# Patient Record
Sex: Female | Born: 1935 | Race: White | Hispanic: No | Marital: Married | Smoking: Never smoker
Health system: Southern US, Community
[De-identification: ages and names within clinical notes are randomized; demographics above are authoritative.]

## PROBLEM LIST (undated history)

## (undated) DIAGNOSIS — E039 Hypothyroidism, unspecified: Secondary | ICD-10-CM

## (undated) DIAGNOSIS — E559 Vitamin D deficiency, unspecified: Secondary | ICD-10-CM

## (undated) DIAGNOSIS — F0390 Unspecified dementia without behavioral disturbance: Secondary | ICD-10-CM

## (undated) DIAGNOSIS — R0989 Other specified symptoms and signs involving the circulatory and respiratory systems: Secondary | ICD-10-CM

## (undated) DIAGNOSIS — I4892 Unspecified atrial flutter: Principal | ICD-10-CM

## (undated) DIAGNOSIS — K589 Irritable bowel syndrome without diarrhea: Secondary | ICD-10-CM

## (undated) DIAGNOSIS — C50919 Malignant neoplasm of unspecified site of unspecified female breast: Secondary | ICD-10-CM

## (undated) HISTORY — PX: BIOPSY THYROID: PRO38

## (undated) HISTORY — PX: MASTECTOMY: SHX3

## (undated) HISTORY — DX: Other specified symptoms and signs involving the circulatory and respiratory systems: R09.89

## (undated) HISTORY — DX: Irritable bowel syndrome without diarrhea: K58.9

## (undated) HISTORY — DX: Hypothyroidism, unspecified: E03.9

## (undated) HISTORY — DX: Vitamin D deficiency, unspecified: E55.9

## (undated) HISTORY — PX: CHOLECYSTECTOMY: SHX55

## (undated) HISTORY — DX: Malignant neoplasm of unspecified site of unspecified female breast: C50.919

---

## 1968-04-19 DIAGNOSIS — C50919 Malignant neoplasm of unspecified site of unspecified female breast: Secondary | ICD-10-CM

## 1968-04-19 HISTORY — DX: Malignant neoplasm of unspecified site of unspecified female breast: C50.919

## 1997-10-01 ENCOUNTER — Ambulatory Visit (HOSPITAL_COMMUNITY): Admission: RE | Admit: 1997-10-01 | Discharge: 1997-10-01 | Payer: Self-pay | Admitting: Internal Medicine

## 1997-10-08 ENCOUNTER — Ambulatory Visit (HOSPITAL_COMMUNITY): Admission: RE | Admit: 1997-10-08 | Discharge: 1997-10-08 | Payer: Self-pay | Admitting: Internal Medicine

## 1997-10-15 ENCOUNTER — Ambulatory Visit (HOSPITAL_COMMUNITY): Admission: RE | Admit: 1997-10-15 | Discharge: 1997-10-15 | Payer: Self-pay | Admitting: Internal Medicine

## 1998-10-13 ENCOUNTER — Ambulatory Visit (HOSPITAL_COMMUNITY): Admission: RE | Admit: 1998-10-13 | Discharge: 1998-10-13 | Payer: Self-pay | Admitting: Internal Medicine

## 1998-10-13 ENCOUNTER — Encounter: Payer: Self-pay | Admitting: Internal Medicine

## 1999-04-07 ENCOUNTER — Other Ambulatory Visit: Admission: RE | Admit: 1999-04-07 | Discharge: 1999-04-07 | Payer: Self-pay | Admitting: Obstetrics and Gynecology

## 1999-10-30 ENCOUNTER — Ambulatory Visit (HOSPITAL_COMMUNITY): Admission: RE | Admit: 1999-10-30 | Discharge: 1999-10-30 | Payer: Self-pay | Admitting: Internal Medicine

## 1999-10-30 ENCOUNTER — Encounter: Payer: Self-pay | Admitting: Internal Medicine

## 2000-03-24 ENCOUNTER — Other Ambulatory Visit: Admission: RE | Admit: 2000-03-24 | Discharge: 2000-03-24 | Payer: Self-pay | Admitting: Obstetrics and Gynecology

## 2000-11-10 ENCOUNTER — Ambulatory Visit (HOSPITAL_COMMUNITY): Admission: RE | Admit: 2000-11-10 | Discharge: 2000-11-10 | Payer: Self-pay | Admitting: Internal Medicine

## 2000-11-10 ENCOUNTER — Encounter: Payer: Self-pay | Admitting: Internal Medicine

## 2000-11-17 ENCOUNTER — Ambulatory Visit (HOSPITAL_COMMUNITY): Admission: RE | Admit: 2000-11-17 | Discharge: 2000-11-17 | Payer: Self-pay | Admitting: Internal Medicine

## 2000-11-17 ENCOUNTER — Encounter: Payer: Self-pay | Admitting: Internal Medicine

## 2000-11-18 ENCOUNTER — Ambulatory Visit (HOSPITAL_COMMUNITY): Admission: RE | Admit: 2000-11-18 | Discharge: 2000-11-18 | Payer: Self-pay | Admitting: Internal Medicine

## 2000-11-18 ENCOUNTER — Encounter: Payer: Self-pay | Admitting: Internal Medicine

## 2000-11-22 ENCOUNTER — Encounter: Payer: Self-pay | Admitting: Internal Medicine

## 2000-11-22 ENCOUNTER — Encounter (INDEPENDENT_AMBULATORY_CARE_PROVIDER_SITE_OTHER): Payer: Self-pay

## 2000-11-22 ENCOUNTER — Ambulatory Visit (HOSPITAL_COMMUNITY): Admission: RE | Admit: 2000-11-22 | Discharge: 2000-11-22 | Payer: Self-pay | Admitting: Internal Medicine

## 2001-03-28 ENCOUNTER — Other Ambulatory Visit: Admission: RE | Admit: 2001-03-28 | Discharge: 2001-03-28 | Payer: Self-pay | Admitting: Obstetrics and Gynecology

## 2001-12-21 ENCOUNTER — Encounter: Payer: Self-pay | Admitting: Internal Medicine

## 2001-12-21 ENCOUNTER — Ambulatory Visit (HOSPITAL_COMMUNITY): Admission: RE | Admit: 2001-12-21 | Discharge: 2001-12-21 | Payer: Self-pay | Admitting: Internal Medicine

## 2002-03-30 ENCOUNTER — Encounter: Payer: Self-pay | Admitting: Internal Medicine

## 2002-03-30 ENCOUNTER — Ambulatory Visit (HOSPITAL_COMMUNITY): Admission: RE | Admit: 2002-03-30 | Discharge: 2002-03-30 | Payer: Self-pay | Admitting: Internal Medicine

## 2002-04-06 ENCOUNTER — Other Ambulatory Visit: Admission: RE | Admit: 2002-04-06 | Discharge: 2002-04-06 | Payer: Self-pay | Admitting: Obstetrics and Gynecology

## 2002-04-17 ENCOUNTER — Ambulatory Visit (HOSPITAL_COMMUNITY): Admission: RE | Admit: 2002-04-17 | Discharge: 2002-04-17 | Payer: Self-pay | Admitting: Internal Medicine

## 2002-04-17 ENCOUNTER — Encounter: Payer: Self-pay | Admitting: Internal Medicine

## 2003-04-30 ENCOUNTER — Other Ambulatory Visit: Admission: RE | Admit: 2003-04-30 | Discharge: 2003-04-30 | Payer: Self-pay | Admitting: Obstetrics and Gynecology

## 2004-04-02 ENCOUNTER — Ambulatory Visit (HOSPITAL_COMMUNITY): Admission: RE | Admit: 2004-04-02 | Discharge: 2004-04-02 | Payer: Self-pay | Admitting: Internal Medicine

## 2004-05-06 ENCOUNTER — Other Ambulatory Visit: Admission: RE | Admit: 2004-05-06 | Discharge: 2004-05-06 | Payer: Self-pay | Admitting: Obstetrics and Gynecology

## 2005-04-14 ENCOUNTER — Ambulatory Visit (HOSPITAL_COMMUNITY): Admission: RE | Admit: 2005-04-14 | Discharge: 2005-04-14 | Payer: Self-pay | Admitting: Internal Medicine

## 2005-05-10 ENCOUNTER — Other Ambulatory Visit: Admission: RE | Admit: 2005-05-10 | Discharge: 2005-05-10 | Payer: Self-pay | Admitting: Obstetrics and Gynecology

## 2007-07-17 ENCOUNTER — Other Ambulatory Visit: Admission: RE | Admit: 2007-07-17 | Discharge: 2007-07-17 | Payer: Self-pay | Admitting: Obstetrics and Gynecology

## 2008-07-23 ENCOUNTER — Other Ambulatory Visit: Admission: RE | Admit: 2008-07-23 | Discharge: 2008-07-23 | Payer: Self-pay | Admitting: Obstetrics and Gynecology

## 2011-08-04 DIAGNOSIS — E559 Vitamin D deficiency, unspecified: Secondary | ICD-10-CM | POA: Diagnosis not present

## 2011-08-04 DIAGNOSIS — Z79899 Other long term (current) drug therapy: Secondary | ICD-10-CM | POA: Diagnosis not present

## 2011-08-04 DIAGNOSIS — R7309 Other abnormal glucose: Secondary | ICD-10-CM | POA: Diagnosis not present

## 2011-08-04 DIAGNOSIS — E782 Mixed hyperlipidemia: Secondary | ICD-10-CM | POA: Diagnosis not present

## 2011-08-04 DIAGNOSIS — I10 Essential (primary) hypertension: Secondary | ICD-10-CM | POA: Diagnosis not present

## 2011-08-04 DIAGNOSIS — Z1212 Encounter for screening for malignant neoplasm of rectum: Secondary | ICD-10-CM | POA: Diagnosis not present

## 2011-09-29 DIAGNOSIS — Z01419 Encounter for gynecological examination (general) (routine) without abnormal findings: Secondary | ICD-10-CM | POA: Diagnosis not present

## 2011-09-29 DIAGNOSIS — Z124 Encounter for screening for malignant neoplasm of cervix: Secondary | ICD-10-CM | POA: Diagnosis not present

## 2011-12-02 DIAGNOSIS — H2510 Age-related nuclear cataract, unspecified eye: Secondary | ICD-10-CM | POA: Diagnosis not present

## 2011-12-23 ENCOUNTER — Encounter: Payer: Self-pay | Admitting: Gastroenterology

## 2012-02-02 DIAGNOSIS — E039 Hypothyroidism, unspecified: Secondary | ICD-10-CM | POA: Diagnosis not present

## 2012-02-02 DIAGNOSIS — Z23 Encounter for immunization: Secondary | ICD-10-CM | POA: Diagnosis not present

## 2012-02-02 DIAGNOSIS — E782 Mixed hyperlipidemia: Secondary | ICD-10-CM | POA: Diagnosis not present

## 2012-02-02 DIAGNOSIS — R030 Elevated blood-pressure reading, without diagnosis of hypertension: Secondary | ICD-10-CM | POA: Diagnosis not present

## 2012-02-02 DIAGNOSIS — Z79899 Other long term (current) drug therapy: Secondary | ICD-10-CM | POA: Diagnosis not present

## 2012-02-02 DIAGNOSIS — E559 Vitamin D deficiency, unspecified: Secondary | ICD-10-CM | POA: Diagnosis not present

## 2012-08-09 DIAGNOSIS — Z79899 Other long term (current) drug therapy: Secondary | ICD-10-CM | POA: Diagnosis not present

## 2012-08-09 DIAGNOSIS — E559 Vitamin D deficiency, unspecified: Secondary | ICD-10-CM | POA: Diagnosis not present

## 2012-08-09 DIAGNOSIS — E782 Mixed hyperlipidemia: Secondary | ICD-10-CM | POA: Diagnosis not present

## 2012-08-09 DIAGNOSIS — Z1212 Encounter for screening for malignant neoplasm of rectum: Secondary | ICD-10-CM | POA: Diagnosis not present

## 2012-08-09 DIAGNOSIS — R7309 Other abnormal glucose: Secondary | ICD-10-CM | POA: Diagnosis not present

## 2012-08-09 DIAGNOSIS — I10 Essential (primary) hypertension: Secondary | ICD-10-CM | POA: Diagnosis not present

## 2012-08-14 ENCOUNTER — Encounter: Payer: Self-pay | Admitting: Gastroenterology

## 2012-08-16 ENCOUNTER — Telehealth: Payer: Self-pay | Admitting: Gastroenterology

## 2012-08-16 NOTE — Telephone Encounter (Signed)
Already had the Procedure somewhere else

## 2013-01-04 DIAGNOSIS — H2510 Age-related nuclear cataract, unspecified eye: Secondary | ICD-10-CM | POA: Diagnosis not present

## 2013-02-13 DIAGNOSIS — Z79899 Other long term (current) drug therapy: Secondary | ICD-10-CM | POA: Diagnosis not present

## 2013-02-13 DIAGNOSIS — E782 Mixed hyperlipidemia: Secondary | ICD-10-CM | POA: Diagnosis not present

## 2013-02-13 DIAGNOSIS — Z23 Encounter for immunization: Secondary | ICD-10-CM | POA: Diagnosis not present

## 2013-02-13 DIAGNOSIS — E039 Hypothyroidism, unspecified: Secondary | ICD-10-CM | POA: Diagnosis not present

## 2013-02-13 DIAGNOSIS — E559 Vitamin D deficiency, unspecified: Secondary | ICD-10-CM | POA: Diagnosis not present

## 2013-02-13 DIAGNOSIS — R7309 Other abnormal glucose: Secondary | ICD-10-CM | POA: Diagnosis not present

## 2013-08-11 DIAGNOSIS — R0989 Other specified symptoms and signs involving the circulatory and respiratory systems: Secondary | ICD-10-CM | POA: Insufficient documentation

## 2013-08-11 DIAGNOSIS — K589 Irritable bowel syndrome without diarrhea: Secondary | ICD-10-CM | POA: Insufficient documentation

## 2013-08-11 DIAGNOSIS — C50919 Malignant neoplasm of unspecified site of unspecified female breast: Secondary | ICD-10-CM | POA: Insufficient documentation

## 2013-08-11 DIAGNOSIS — E039 Hypothyroidism, unspecified: Secondary | ICD-10-CM | POA: Insufficient documentation

## 2013-08-13 ENCOUNTER — Encounter: Payer: Self-pay | Admitting: Internal Medicine

## 2013-08-13 ENCOUNTER — Ambulatory Visit (INDEPENDENT_AMBULATORY_CARE_PROVIDER_SITE_OTHER): Payer: PRIVATE HEALTH INSURANCE | Admitting: Internal Medicine

## 2013-08-13 VITALS — BP 128/70 | HR 80 | Temp 98.1°F | Resp 16 | Ht 68.0 in | Wt 126.6 lb

## 2013-08-13 DIAGNOSIS — I10 Essential (primary) hypertension: Secondary | ICD-10-CM | POA: Diagnosis not present

## 2013-08-13 DIAGNOSIS — E782 Mixed hyperlipidemia: Secondary | ICD-10-CM | POA: Diagnosis not present

## 2013-08-13 DIAGNOSIS — Z789 Other specified health status: Secondary | ICD-10-CM

## 2013-08-13 DIAGNOSIS — F0280 Dementia in other diseases classified elsewhere without behavioral disturbance: Secondary | ICD-10-CM | POA: Insufficient documentation

## 2013-08-13 DIAGNOSIS — G301 Alzheimer's disease with late onset: Secondary | ICD-10-CM

## 2013-08-13 DIAGNOSIS — Z79899 Other long term (current) drug therapy: Secondary | ICD-10-CM | POA: Diagnosis not present

## 2013-08-13 DIAGNOSIS — R7309 Other abnormal glucose: Secondary | ICD-10-CM | POA: Diagnosis not present

## 2013-08-13 DIAGNOSIS — R0989 Other specified symptoms and signs involving the circulatory and respiratory systems: Secondary | ICD-10-CM

## 2013-08-13 DIAGNOSIS — Z1212 Encounter for screening for malignant neoplasm of rectum: Secondary | ICD-10-CM

## 2013-08-13 DIAGNOSIS — E559 Vitamin D deficiency, unspecified: Secondary | ICD-10-CM | POA: Diagnosis not present

## 2013-08-13 DIAGNOSIS — R7303 Prediabetes: Secondary | ICD-10-CM | POA: Insufficient documentation

## 2013-08-13 DIAGNOSIS — Z1331 Encounter for screening for depression: Secondary | ICD-10-CM

## 2013-08-13 LAB — CBC WITH DIFFERENTIAL/PLATELET
Basophils Absolute: 0 10*3/uL (ref 0.0–0.1)
Basophils Relative: 1 % (ref 0–1)
EOS PCT: 2 % (ref 0–5)
Eosinophils Absolute: 0.1 10*3/uL (ref 0.0–0.7)
HEMATOCRIT: 40.4 % (ref 36.0–46.0)
Hemoglobin: 13.7 g/dL (ref 12.0–15.0)
LYMPHS ABS: 0.9 10*3/uL (ref 0.7–4.0)
LYMPHS PCT: 23 % (ref 12–46)
MCH: 32.3 pg (ref 26.0–34.0)
MCHC: 33.9 g/dL (ref 30.0–36.0)
MCV: 95.3 fL (ref 78.0–100.0)
Monocytes Absolute: 0.4 10*3/uL (ref 0.1–1.0)
Monocytes Relative: 10 % (ref 3–12)
Neutro Abs: 2.6 10*3/uL (ref 1.7–7.7)
Neutrophils Relative %: 64 % (ref 43–77)
PLATELETS: 224 10*3/uL (ref 150–400)
RBC: 4.24 MIL/uL (ref 3.87–5.11)
RDW: 13.9 % (ref 11.5–15.5)
WBC: 4.1 10*3/uL (ref 4.0–10.5)

## 2013-08-13 NOTE — Progress Notes (Signed)
Patient ID: Amber Chan, female   DOB: October 26, 1934, 79 y.o.   MRN: [id number]   Annual Screening Comprehensive Examination  This very nice 79 y.o. MWF presents for complete physical.  Patient has been followed for Hx of elevated BP, screening for abnormal glucose, Hypothyroidism, Hyperlipidemia, remote Hx/o Breast cancer, SDAT and Vitamin D Deficiency. Also, patient has mild SDAT and remains functional at home in her ADL's and personal hygiene, but requires supervision by her spouse and primarily she is limited by ST memory loss.    Labile elevated BP in the past has been monitored expectantly Patient's BP has been controlled with otday's BP: 128/70 mmHg. Patient denies any cardiac symptoms as chest pain, palpitations, shortness of breath, dizziness or ankle swelling.   Patient's hyperlipidemia is controlled with diet. Patient denies myalgias. Last cholesterol last visit was 164, triglycerides 81, HDL 66 and LDL 82 in Oct 2014 - at goal.     Patient has prediabetes/insulin resistance with elevated insulin of 54 in Apr 2014 and 59 in Oct 2014 - both with nl A1c's but felt consistent with insulin resistance and thus early diabetes. Patient denies reactive hypoglycemic symptoms, visual blurring, diabetic polys, or paresthesias.    Also, patient has been on thyroid replacement since 2010. Finally, patient has history of Vitamin D Deficiency of 22 in Jan 2008 and last vitamin D was 27 in Oct 2014.  Medication Sig  . BABY ASPIRIN PO Take 81 mg by mouth daily.  . Cholecalciferol (VITAMIN D PO) Take 2,000 Units by mouth 2 (two) times daily.   Marland Kitchen donepezil (ARICEPT) 10 MG tablet Take 10 mg by mouth at bedtime.  Marland Kitchen levothyroxine  50 MCG tablet Take 50 mcg by mouth daily before breakfast.   Allergies no known allergies  Past Medical History  Diagnosis Date  . Labile hypertension   . Hypothyroid   . IBS (irritable bowel syndrome)   . Vitamin D deficiency   . Breast cancer 1970    Right   Past  Surgical History  Procedure Laterality Date  . Mastectomy      right 1978 left 1980  . Cholecystectomy      1988  . Biopsy thyroid      1999   Family History  Problem Relation Age of Onset  . Hypertension Mother   . Cancer Mother     colon  . Stroke Father   . Heart disease Father   . Diabetes Brother     History  Substance Use Topics  . Smoking status: Never Smoker   . Smokeless tobacco: Not on file  . Alcohol Use: No    ROS Constitutional: Denies fever, chills, weight loss/gain, headaches, insomnia, fatigue, night sweats, and change in appetite. Eyes: Denies redness, blurred vision, diplopia, discharge, itchy, watery eyes.  ENT: Denies discharge, congestion, post nasal drip, epistaxis, sore throat, earache, hearing loss, dental pain, Tinnitus, Vertigo, Sinus pain, snoring.  Cardio: Denies chest pain, palpitations, irregular heartbeat, syncope, dyspnea, diaphoresis, orthopnea, PND, claudication, edema Respiratory: denies cough, dyspnea, DOE, pleurisy, hoarseness, laryngitis, wheezing.  Gastrointestinal: Denies dysphagia, heartburn, reflux, water brash, pain, cramps, nausea, vomiting, bloating, diarrhea, constipation, hematemesis, melena, hematochezia, jaundice, hemorrhoids Genitourinary: Denies dysuria, frequency, urgency, nocturia, hesitancy, discharge, hematuria, flank pain Breast:Breast lumps, nipple discharge, bleeding.  Musculoskeletal: Denies arthralgia, myalgia, stiffness, Jt. Swelling, pain, limp, and strain/sprain. Skin: Denies puritis, rash, hives, warts, acne, eczema, changing in skin lesion Neuro: No weakness, tremor, incoordination, balance issues, spasms, paresthesia, pain Psychiatric: Denies confusion, , sensory loss  Endocrine: Denies change in weight, skin, hair change, nocturia, and paresthesia, diabetic polys, visual blurring, hyper / hypo glycemic episodes.  Heme/Lymph: No excessive bleeding, bruising, enlarged lymph nodes.  Physical Exam  BP 128/70   Pulse 80  Temp 98.1 F   Resp 16  Ht 5\' 8"    Wt 126 lb 9.6 oz   BMI 19.25 kg/m2  General Appearance: Well nourished, in no apparent distress. Eyes: PERRLA, EOMs, conjunctiva no swelling or erythema, normal fundi and vessels. Sinuses: No frontal/maxillary tenderness ENT/Mouth: EACs patent / TMs  nl. Nares clear without erythema, swelling, mucoid exudates. Oral hygiene is good. No erythema, swelling, or exudate. Tongue normal, non-obstructing. Tonsils not swollen or erythematous. Hearing normal.  Neck: Supple, thyroid normal. No bruits, nodes or JVD. Respiratory: Respiratory effort normal.  BS equal and clear bilateral without rales, rhonci, wheezing or stridor. Cardio: Heart sounds are normal with regular rate and rhythm and no murmurs, rubs or gallops. Peripheral pulses are normal and equal bilaterally without edema. No aortic or femoral bruits. Chest: symmetric with normal excursions and percussion. Breasts: Symmetric, without lumps, nipple discharge, retractions, or fibrocystic changes.  Abdomen: Flat, soft, with bowl sounds. Nontender, no guarding, rebound, hernias, masses, or organomegaly.  Lymphatics: Non tender without lymphadenopathy.  Genitourinary:  Musculoskeletal: Full ROM all peripheral extremities, joint stability, 5/5 strength, and normal gait. Skin: Warm and dry without rashes, lesions, cyanosis, clubbing or  ecchymosis.  Neuro: Cranial nerves intact, reflexes equal bilaterally. Normal muscle tone, no cerebellar symptoms. Sensation intact.  Pysch: Awake and oriented X 3, normal affect, Insight and Judgment appropriate.   Assessment and Plan  1. Annual Screening Examination 2. Hypertension, Labile 3. Hyperlipidemia - controlled with diet 4. Pre Diabetes/ Insulin Resistance 5. Vitamin D Deficiency 6. Breast cancer, Right (1976) 7. SDAT  Continue prudent diet as discussed, weight control, BP monitoring, regular exercise, and medications. Discussed med's effects and SE's.  Screening labs and tests as requested with regular follow-up as recommended.

## 2013-08-13 NOTE — Patient Instructions (Signed)

## 2013-08-14 LAB — HEMOGLOBIN A1C
Hgb A1c MFr Bld: 5.6 % (ref <5.7)
Mean Plasma Glucose: 114 mg/dL (ref <117)

## 2013-08-14 LAB — INSULIN, FASTING: Insulin fasting, serum: 42 u[IU]/mL — ABNORMAL HIGH (ref 3–28)

## 2013-08-14 LAB — URINALYSIS, MICROSCOPIC ONLY
Bacteria, UA: NONE SEEN
CRYSTALS: NONE SEEN

## 2013-08-14 LAB — TSH: TSH: 2.193 u[IU]/mL (ref 0.350–4.500)

## 2013-08-14 LAB — BASIC METABOLIC PANEL WITH GFR
BUN: 21 mg/dL (ref 6–23)
CHLORIDE: 103 meq/L (ref 96–112)
CO2: 29 meq/L (ref 19–32)
CREATININE: 0.99 mg/dL (ref 0.50–1.10)
Calcium: 9 mg/dL (ref 8.4–10.5)
GFR, Est African American: 64 mL/min
GFR, Est Non African American: 56 mL/min — ABNORMAL LOW
Glucose, Bld: 93 mg/dL (ref 70–99)
Potassium: 3.8 mEq/L (ref 3.5–5.3)
Sodium: 143 mEq/L (ref 135–145)

## 2013-08-14 LAB — VITAMIN D 25 HYDROXY (VIT D DEFICIENCY, FRACTURES): Vit D, 25-Hydroxy: 81 ng/mL (ref 30–89)

## 2013-08-14 LAB — HEPATIC FUNCTION PANEL
ALBUMIN: 4 g/dL (ref 3.5–5.2)
ALK PHOS: 74 U/L (ref 39–117)
ALT: 15 U/L (ref 0–35)
AST: 21 U/L (ref 0–37)
BILIRUBIN DIRECT: 0.1 mg/dL (ref 0.0–0.3)
BILIRUBIN INDIRECT: 0.4 mg/dL (ref 0.2–1.2)
BILIRUBIN TOTAL: 0.5 mg/dL (ref 0.2–1.2)
Total Protein: 6.3 g/dL (ref 6.0–8.3)

## 2013-08-14 LAB — LIPID PANEL
CHOL/HDL RATIO: 2.5 ratio
Cholesterol: 158 mg/dL (ref 0–200)
HDL: 63 mg/dL (ref >39)
LDL CALC: 78 mg/dL (ref 0–99)
Triglycerides: 85 mg/dL (ref <150)
VLDL: 17 mg/dL (ref 0–40)

## 2013-08-14 LAB — MICROALBUMIN / CREATININE URINE RATIO
Creatinine, Urine: 183.9 mg/dL
MICROALB UR: 0.89 mg/dL (ref 0.00–1.89)
Microalb Creat Ratio: 4.8 mg/g (ref 0.0–30.0)

## 2013-08-14 LAB — MAGNESIUM: Magnesium: 1.9 mg/dL (ref 1.5–2.5)

## 2013-08-22 ENCOUNTER — Other Ambulatory Visit (INDEPENDENT_AMBULATORY_CARE_PROVIDER_SITE_OTHER): Payer: Medicare Other | Admitting: *Deleted

## 2013-08-22 DIAGNOSIS — Z1212 Encounter for screening for malignant neoplasm of rectum: Secondary | ICD-10-CM

## 2013-08-22 LAB — POC HEMOCCULT BLD/STL (HOME/3-CARD/SCREEN)
FECAL OCCULT BLD: NEGATIVE
FECAL OCCULT BLD: NEGATIVE
Fecal Occult Blood, POC: NEGATIVE

## 2013-08-25 ENCOUNTER — Observation Stay (HOSPITAL_COMMUNITY): Payer: Medicare Other

## 2013-08-25 ENCOUNTER — Inpatient Hospital Stay (HOSPITAL_COMMUNITY)
Admission: EM | Admit: 2013-08-25 | Discharge: 2013-08-29 | DRG: 251 | Disposition: A | Discharge status: Home / Self Care | Payer: Medicare Other | Attending: Internal Medicine | Admitting: Internal Medicine

## 2013-08-25 ENCOUNTER — Encounter (HOSPITAL_COMMUNITY): Payer: Self-pay | Admitting: Emergency Medicine

## 2013-08-25 ENCOUNTER — Emergency Department (HOSPITAL_COMMUNITY): Payer: Medicare Other

## 2013-08-25 DIAGNOSIS — IMO0002 Reserved for concepts with insufficient information to code with codable children: Secondary | ICD-10-CM

## 2013-08-25 DIAGNOSIS — Z9181 History of falling: Secondary | ICD-10-CM | POA: Diagnosis not present

## 2013-08-25 DIAGNOSIS — R0989 Other specified symptoms and signs involving the circulatory and respiratory systems: Secondary | ICD-10-CM

## 2013-08-25 DIAGNOSIS — I4892 Unspecified atrial flutter: Secondary | ICD-10-CM | POA: Diagnosis not present

## 2013-08-25 DIAGNOSIS — R42 Dizziness and giddiness: Secondary | ICD-10-CM | POA: Diagnosis not present

## 2013-08-25 DIAGNOSIS — G309 Alzheimer's disease, unspecified: Secondary | ICD-10-CM | POA: Diagnosis present

## 2013-08-25 DIAGNOSIS — R55 Syncope and collapse: Secondary | ICD-10-CM

## 2013-08-25 DIAGNOSIS — I10 Essential (primary) hypertension: Secondary | ICD-10-CM | POA: Diagnosis not present

## 2013-08-25 DIAGNOSIS — E782 Mixed hyperlipidemia: Secondary | ICD-10-CM | POA: Diagnosis not present

## 2013-08-25 DIAGNOSIS — I469 Cardiac arrest, cause unspecified: Secondary | ICD-10-CM | POA: Diagnosis present

## 2013-08-25 DIAGNOSIS — R112 Nausea with vomiting, unspecified: Secondary | ICD-10-CM | POA: Diagnosis not present

## 2013-08-25 DIAGNOSIS — Z853 Personal history of malignant neoplasm of breast: Secondary | ICD-10-CM

## 2013-08-25 DIAGNOSIS — C50919 Malignant neoplasm of unspecified site of unspecified female breast: Secondary | ICD-10-CM | POA: Diagnosis not present

## 2013-08-25 DIAGNOSIS — Z9010 Acquired absence of unspecified breast and nipple: Secondary | ICD-10-CM

## 2013-08-25 DIAGNOSIS — Z7982 Long term (current) use of aspirin: Secondary | ICD-10-CM | POA: Diagnosis not present

## 2013-08-25 DIAGNOSIS — E039 Hypothyroidism, unspecified: Secondary | ICD-10-CM | POA: Diagnosis present

## 2013-08-25 DIAGNOSIS — G301 Alzheimer's disease with late onset: Secondary | ICD-10-CM

## 2013-08-25 DIAGNOSIS — F0280 Dementia in other diseases classified elsewhere without behavioral disturbance: Secondary | ICD-10-CM | POA: Diagnosis present

## 2013-08-25 DIAGNOSIS — R404 Transient alteration of awareness: Secondary | ICD-10-CM | POA: Diagnosis not present

## 2013-08-25 DIAGNOSIS — J90 Pleural effusion, not elsewhere classified: Secondary | ICD-10-CM | POA: Diagnosis not present

## 2013-08-25 DIAGNOSIS — R7309 Other abnormal glucose: Secondary | ICD-10-CM

## 2013-08-25 DIAGNOSIS — R001 Bradycardia, unspecified: Secondary | ICD-10-CM

## 2013-08-25 DIAGNOSIS — Z8249 Family history of ischemic heart disease and other diseases of the circulatory system: Secondary | ICD-10-CM | POA: Diagnosis not present

## 2013-08-25 DIAGNOSIS — J449 Chronic obstructive pulmonary disease, unspecified: Secondary | ICD-10-CM | POA: Diagnosis not present

## 2013-08-25 DIAGNOSIS — E559 Vitamin D deficiency, unspecified: Secondary | ICD-10-CM

## 2013-08-25 DIAGNOSIS — K589 Irritable bowel syndrome without diarrhea: Secondary | ICD-10-CM | POA: Diagnosis present

## 2013-08-25 DIAGNOSIS — Z79899 Other long term (current) drug therapy: Secondary | ICD-10-CM | POA: Diagnosis not present

## 2013-08-25 DIAGNOSIS — I319 Disease of pericardium, unspecified: Secondary | ICD-10-CM | POA: Diagnosis not present

## 2013-08-25 DIAGNOSIS — J438 Other emphysema: Secondary | ICD-10-CM | POA: Diagnosis not present

## 2013-08-25 DIAGNOSIS — I498 Other specified cardiac arrhythmias: Secondary | ICD-10-CM | POA: Diagnosis not present

## 2013-08-25 HISTORY — DX: Unspecified dementia, unspecified severity, without behavioral disturbance, psychotic disturbance, mood disturbance, and anxiety: F03.90

## 2013-08-25 HISTORY — DX: Unspecified atrial flutter: I48.92

## 2013-08-25 LAB — CBC
HCT: 43 % (ref 36.0–46.0)
Hemoglobin: 14.4 g/dL (ref 12.0–15.0)
MCH: 32.1 pg (ref 26.0–34.0)
MCHC: 33.5 g/dL (ref 30.0–36.0)
MCV: 96 fL (ref 78.0–100.0)
PLATELETS: 189 10*3/uL (ref 150–400)
RBC: 4.48 MIL/uL (ref 3.87–5.11)
RDW: 13.3 % (ref 11.5–15.5)
WBC: 9.7 10*3/uL (ref 4.0–10.5)

## 2013-08-25 LAB — BASIC METABOLIC PANEL
BUN: 22 mg/dL (ref 6–23)
CHLORIDE: 104 meq/L (ref 96–112)
CO2: 25 mEq/L (ref 19–32)
CREATININE: 1.06 mg/dL (ref 0.50–1.10)
Calcium: 8.8 mg/dL (ref 8.4–10.5)
GFR, EST AFRICAN AMERICAN: 58 mL/min — AB (ref >90)
GFR, EST NON AFRICAN AMERICAN: 50 mL/min — AB (ref >90)
Glucose, Bld: 142 mg/dL — ABNORMAL HIGH (ref 70–99)
POTASSIUM: 3.5 meq/L — AB (ref 3.7–5.3)
Sodium: 143 mEq/L (ref 137–147)

## 2013-08-25 LAB — PROTIME-INR
INR: 1.07 (ref 0.00–1.49)
Prothrombin Time: 13.7 seconds (ref 11.6–15.2)

## 2013-08-25 LAB — I-STAT TROPONIN, ED: TROPONIN I, POC: 0.01 ng/mL (ref 0.00–0.08)

## 2013-08-25 LAB — CBG MONITORING, ED: GLUCOSE-CAPILLARY: 114 mg/dL — AB (ref 70–99)

## 2013-08-25 LAB — I-STAT CG4 LACTIC ACID, ED: LACTIC ACID, VENOUS: 1.66 mmol/L (ref 0.5–2.2)

## 2013-08-25 MED ORDER — SODIUM CHLORIDE 0.9 % IV BOLUS (SEPSIS)
1000.0000 mL | Freq: Once | INTRAVENOUS | Status: AC
  Administered 2013-08-25: 1000 mL via INTRAVENOUS

## 2013-08-25 MED ORDER — IOHEXOL 350 MG/ML SOLN
100.0000 mL | Freq: Once | INTRAVENOUS | Status: AC | PRN
  Administered 2013-08-25: 80 mL via INTRAVENOUS

## 2013-08-25 NOTE — ED Notes (Addendum)
Patient returned from [facility]. Will have the patient transferred upstairs.

## 2013-08-25 NOTE — ED Notes (Signed)
Called CT for ETA time for patient.

## 2013-08-25 NOTE — ED Notes (Signed)
Dr.Walden at the bedside.  

## 2013-08-25 NOTE — ED Notes (Signed)
Per EMS, the patient's husband called from home for syncope episode with temporary loss of consciousness.  He lowered her down and lowered her to the floor.  When EMS arrived, she was conscious, but did have a seizure episode and lost pulses for 45 seconds.  Initial BP of 60 on palpation, so patient was given 561mL and placed in trendelenberg.  BP 130/86 was last reading. She also received 4mg  of zofran. She has 18g in Left A/C, and 20g in right AC.  Patient is wearing non-rebreather on arrival.

## 2013-08-25 NOTE — Consult Note (Addendum)
CARDIOLOGY CONSULT NOTE   Patient ID: Amber Chan MRN: [id number], DOB/AGE: 08/15/1934   Admit date: 08/25/2013 Date of Consult: 08/25/2013   Primary Physician: Alesia Richards, MD Primary Cardiologist: None  Pt. Profile 79F with dementia, labile HTN, hypothyroidism, remote history of breast cancer, who is admitted with syncope and found to be in new onset AFL with absent ventricular response on EMS rhythm strip. Although rhythm strip only includes a few seconds, she clearly had a pause of at least 6 seconds in setting of AFL. EMS reported she was without a pulse for 45 seconds.    Problem List  Past Medical History  Diagnosis Date  . Labile hypertension   . Hypothyroid   . IBS (irritable bowel syndrome)   . Vitamin D deficiency   . Breast cancer 1970    Right    Past Surgical History  Procedure Laterality Date  . Mastectomy      right 1978 left 1980  . Cholecystectomy      1988  . Biopsy thyroid      1999     Allergies  No Known Allergies  HPI   79F with dementia, labile HTN, hypothyroidism, remote history of breast cancer, who is admitted with syncope and found to be in new onset AFL with absent ventricular response on EMS rhythm strip.  Per husband, patient got up from a seated positing and had LOC without prodrome. Was in [place] prior to this. EMS was called. With EMS, she developed a "pulseless" episode lasting for 45 seconds.  She regained consciousness before CPR was initiated.   Although rhythm strip from EMS only includes a few seconds, she clearly had a pause of at least 6 seconds in setting of AFL. She is not on any nodal agents.   She has had multiple falls but they have not been witnessed and husband is not sure if there was LOC (and patient is a poor historian).    Family History Family History  Problem Relation Age of Onset  . Hypertension Mother   . Cancer Mother     colon  . Stroke Father   . Heart disease Father   . Diabetes  Brother      Social History History   Social History  . Marital Status: Married    Spouse Name: N/A    Number of Children: N/A  . Years of Education: N/A   Occupational History  . Not on file.   Social History Main Topics  . Smoking status: Never Smoker   . Smokeless tobacco: Not on file  . Alcohol Use: No  . Drug Use: No  . Sexual Activity: Not on file   Other Topics Concern  . Not on file   Social History Narrative  . No narrative on file     Review of Systems All systems reviewed and are negative except as noted above.  Physical Exam  Blood pressure 133/77, pulse 80, temperature 98.1 F (36.7 C), temperature source Oral, resp. rate 20, height 5\' 7"  (1.702 m), weight 58.968 kg (130 lb), SpO2 99.00%.  General: Pleasant, NAD. Thin, frail.  Psych: Normal affect. Neuro: Alert. Moves all extremities spontaneously. HEENT: Normal  Neck: Supple without bruits or JVD. Lungs:  Resp regular and unlabored, CTA. Heart: RRR no s3, s4, or murmurs. Abdomen: Soft, non-tender, non-distended, BS + x 4.  Extremities: No clubbing, cyanosis or edema. DP/PT/Radials 2+ and equal bilaterally.  Labs  No results found for this basename: CKTOTAL, CKMB, TROPONINI,  in the last 72 hours Lab Results  Component Value Date   WBC 9.7 08/25/2013   HGB 14.4 08/25/2013   HCT 43.0 08/25/2013   MCV 96.0 08/25/2013   PLT 189 08/25/2013    Recent Labs Lab 08/25/13 2033  NA 143  K 3.5*  CL 104  CO2 25  BUN 22  CREATININE 1.06  CALCIUM 8.8  GLUCOSE 142*   Lab Results  Component Value Date   CHOL 158 08/13/2013   HDL 63 08/13/2013   LDLCALC 78 08/13/2013   TRIG 85 08/13/2013   No results found for this basename: DDIMER    Radiology/Studies  Dg Chest Port 1 View  08/25/2013   CLINICAL DATA:  Chest pain. Current history of treated hypothyroidism, labile hypertension. Prior history of breast cancer post mastectomy.  EXAM: PORTABLE CHEST - 1 VIEW  COMPARISON:  CHEST-2V dated 05/26/2007; DG CHEST 2  VIEW dated 04/14/2005; DG CHEST 2 VIEW dated 04/02/2004; DG CHEST 2 VIEW dated 04/17/2002  FINDINGS: Cardiac silhouette moderately to markedly enlarged but stable. Calcified granuloma in the right upper lobe, unchanged. Emphysematous changes throughout both lungs with hyperinflation, unchanged. Lungs otherwise clear. No localized airspace consolidation. No pleural effusions. No pneumothorax. Normal pulmonary vascularity.  IMPRESSION: COPD/emphysema. No acute cardiopulmonary disease. Stable moderate to marked cardiomegaly.   Electronically Signed   By: Evangeline Dakin M.D.   On: 08/25/2013 21:12    ECG Sinus. RAE. Low volts.   ASSESSMENT AND PLAN 89F with dementia, labile HTN, hypothyroidism, remote history of breast cancer, who is admitted with syncope and found to be in new onset AFL with absent ventricular response on EMS rhythm strip. Although rhythm strip only includes a few seconds, she clearly had a pause of at least 6 seconds in setting of AFL.  She is admitted to the medical service.   AFL, new onset, with slow ventricular response. CHADSVASC score of 3 (HTN = 1, Age = 2) 1. Avoid nodal agents 2. Obtain echocardiogram 3. Check TSH 4. EP consult for consideration of permanent pacemaker.  5. Telemetry monitoring 6. Risk/benefit ratio of systemic anticoagulation may favor no anticoagulation given history of falls. However, if falls are secondary to brady arrhythmias, it could in fact be safe.   Signed, Lamar Sprinkles, MD  08/25/2013, 11:06 PM

## 2013-08-25 NOTE — ED Notes (Signed)
Patient transported to CT 

## 2013-08-25 NOTE — ED Notes (Signed)
Called CT to see how soon patient can go for testing.  Patient is currently on the list.

## 2013-08-25 NOTE — ED Notes (Signed)
Portable CXR is at the bedside.

## 2013-08-25 NOTE — ED Notes (Signed)
Attempted to call report

## 2013-08-25 NOTE — ED Notes (Signed)
Reported to receiving nurse that patient will report to floor after CTA is completed.

## 2013-08-25 NOTE — ED Provider Notes (Signed)
CSN: [id number]     Arrival date & time 08/25/13  2017 History   First MD Initiated Contact with Patient 08/25/13 2025     Chief Complaint  Patient presents with  . Loss of Consciousness     (Consider location/radiation/quality/duration/timing/severity/associated sxs/prior Treatment) HPI Comments: First episode without preceding symptoms at home - lowered to floor by husband. 2nd episode with EMS - pulseless for about 45 seconds, rhythm strips produced by EMS. Patient regained consciousness prior to compressions being started.  Patient is a 79 y.o. female presenting with syncope. The history is provided by the patient.  Loss of Consciousness Episode history:  Multiple Most recent episode:  Today Duration:  45 seconds Timing:  Intermittent Progression:  Unchanged Chronicity:  New Context comment:  Spontaneously Witnessed: yes   Relieved by:  Nothing Associated symptoms: no chest pain, no fever and no shortness of breath   Risk factors: no congenital heart disease and no coronary artery disease     Past Medical History  Diagnosis Date  . Labile hypertension   . Hypothyroid   . IBS (irritable bowel syndrome)   . Vitamin D deficiency   . Breast cancer 1970    Right   Past Surgical History  Procedure Laterality Date  . Mastectomy      right 1978 left 1980  . Cholecystectomy      1988  . Biopsy thyroid      1999   Family History  Problem Relation Age of Onset  . Hypertension Mother   . Cancer Mother     colon  . Stroke Father   . Heart disease Father   . Diabetes Brother    History  Substance Use Topics  . Smoking status: Never Smoker   . Smokeless tobacco: Not on file  . Alcohol Use: No   OB History   Grav Para Term Preterm Abortions TAB SAB Ect Mult Living                 Review of Systems  Constitutional: Negative for fever.  Respiratory: Negative for cough and shortness of breath.   Cardiovascular: Positive for syncope. Negative for chest pain and leg  swelling.  All other systems reviewed and are negative.     Allergies  Review of patient's allergies indicates no known allergies.  Home Medications   Prior to Admission medications   Medication Sig Start Date End Date Taking? Authorizing Provider  BABY ASPIRIN PO Take 81 mg by mouth daily.    Historical Provider, MD  calcium carbonate (TUMS - DOSED IN MG ELEMENTAL CALCIUM) 500 MG chewable tablet Chew 2 tablets by mouth 2 (two) times daily.    Historical Provider, MD  Cholecalciferol (VITAMIN D PO) Take 2,000 Units by mouth 2 (two) times daily.     Historical Provider, MD  donepezil (ARICEPT) 10 MG tablet Take 10 mg by mouth at bedtime.    Historical Provider, MD  levothyroxine (SYNTHROID, LEVOTHROID) 50 MCG tablet Take 50 mcg by mouth daily before breakfast.    Historical Provider, MD   BP 137/70  Pulse 74  Temp(Src) 98.1 F (36.7 C) (Oral)  Resp 24  Ht 5\' 7"  (1.702 m)  Wt 130 lb (58.968 kg)  BMI 20.36 kg/m2  SpO2 100% Physical Exam  Nursing note and vitals reviewed. Constitutional: She appears well-developed and well-nourished. No distress.  HENT:  Head: Normocephalic and atraumatic.  Eyes: EOM are normal. Pupils are equal, round, and reactive to light.  Neck: Normal range of  motion. Neck supple.  Cardiovascular: Normal rate and regular rhythm.  Exam reveals no friction rub.   No murmur heard. Pulmonary/Chest: Effort normal and breath sounds normal. No respiratory distress. She has no wheezes. She has no rales.  Abdominal: Soft. She exhibits no distension. There is no tenderness. There is no rebound.  Musculoskeletal: Normal range of motion. She exhibits no edema.  Neurological: She is alert. No cranial nerve deficit. She exhibits normal muscle tone.  Skin: No rash noted. She is not diaphoretic.    ED Course  Procedures (including critical care time) Labs Review Labs Reviewed  CBC  BASIC METABOLIC PANEL  I-STAT TROPOININ, ED  I-STAT CG4 LACTIC ACID, ED     Imaging Review Dg Chest Port 1 View  08/25/2013   CLINICAL DATA:  Chest pain. Current history of treated hypothyroidism, labile hypertension. Prior history of breast cancer post mastectomy.  EXAM: PORTABLE CHEST - 1 VIEW  COMPARISON:  CHEST-2V dated 05/26/2007; DG CHEST 2 VIEW dated 04/14/2005; DG CHEST 2 VIEW dated 04/02/2004; DG CHEST 2 VIEW dated 04/17/2002  FINDINGS: Cardiac silhouette moderately to markedly enlarged but stable. Calcified granuloma in the right upper lobe, unchanged. Emphysematous changes throughout both lungs with hyperinflation, unchanged. Lungs otherwise clear. No localized airspace consolidation. No pleural effusions. No pneumothorax. Normal pulmonary vascularity.  IMPRESSION: COPD/emphysema. No acute cardiopulmonary disease. Stable moderate to marked cardiomegaly.   Electronically Signed   By: Evangeline Dakin M.D.   On: 08/25/2013 21:12     EKG Interpretation   Date/Time:  Saturday Aug 25 2013 20:20:33 EDT Ventricular Rate:  81 PR Interval:  174 QRS Duration: 98 QT Interval:  403 QTC Calculation: 468 R Axis:   28 Text Interpretation:  Age not entered, assumed to be  79 years old for  purpose of ECG interpretation Sinus rhythm Right atrial enlargement Low  voltage, extremity leads No prior EKG Confirmed by Mingo Amber  MD, Karlisha Mathena  ([id number]) on 08/25/2013 10:16:29 PM      MDM   Final diagnoses:  Syncope  IBS (irritable bowel syndrome)  Labile hypertension  Mixed hyperlipidemia  Pulseless electrical activity  SDAT (senile dementia of Alzheimer's type)    79 year old female presents with syncope. Had episode of syncope at home, no preceding symptoms. Did not fall the floor, floor by husband. Conscious with EMS but did have another episode of pulselessness, mild trembling for about 45 seconds in the EMS truck. Compressions were about to be started with the mechanical compression device but she regained consciousness. EKG strips from EMS show asystole escape rhythms at  times. Upon arrival, patient's blood pressures are normal, she is talking, laughing, states she feels well. Did not feel anything other than mild tiredness earlier today. Remote history of breast cancer with mastectomy. We'll start EKG, CBC, troponins, lytes. Labs all unremarkable. Patient admitted to medicine, seen by Cardiology, who thought this was atrial flutter without ventricular response - placed in Stepdown.   Osvaldo Shipper, MD 08/25/13 [phone]

## 2013-08-25 NOTE — H&P (Signed)
PCP:   Alesia Richards, MD   Chief Complaint:  Passed out twice  HPI: 79 yo female h/o moderate dementia, labile bp, lives at home with her husband just got back from car ride from [place] was at home tonight when she had a sudden passing out episode in front of her husband.  Husband is present and says she just suddenly passed out while standing up, no prodrome, she had no complaints of anything.  He caught her and gently placed her on the floor.  No seizure activity.  He ran to call 911.  When he got back she was up and awake and back to normal.  They just took a car trip to [place], one day was driving 8 hours.  She doesn't complain much.  ems arrived, loaded her in the ambulance then while in the ambulance on the stretcher she became pulseless for about 45 seconds and just when they were about to do cpr she awoke and got a pulse.  She became unresponsive during this time and her bp was 60/palp.  Her rhythm stipe at the time showed aflutter with no ventricular response.  She is now in the ED, responsive with stable vitals.  Review of Systems:  Positive and negative as per HPI otherwise all other systems are negative per husband  Past Medical History: Past Medical History  Diagnosis Date  . Labile hypertension   . Hypothyroid   . IBS (irritable bowel syndrome)   . Vitamin D deficiency   . Breast cancer 1970    Right   Past Surgical History  Procedure Laterality Date  . Mastectomy      right 1978 left 1980  . Cholecystectomy      1988  . Biopsy thyroid      1999    Medications: Prior to Admission medications   Medication Sig Start Date End Date Taking? Authorizing Provider  BABY ASPIRIN PO Take 81 mg by mouth daily.   Yes Historical Provider, MD  calcium carbonate (TUMS - DOSED IN MG ELEMENTAL CALCIUM) 500 MG chewable tablet Chew 2 tablets by mouth 2 (two) times daily.   Yes Historical Provider, MD  Cholecalciferol (VITAMIN D PO) Take 2,000 Units by mouth 2 (two) times  daily.    Yes Historical Provider, MD  donepezil (ARICEPT) 10 MG tablet Take 10 mg by mouth at bedtime.   Yes Historical Provider, MD  levothyroxine (SYNTHROID, LEVOTHROID) 50 MCG tablet Take 50 mcg by mouth daily before breakfast.   Yes Historical Provider, MD    Allergies:  No Known Allergies  Social History:  reports that she has never smoked. She does not have any smokeless tobacco history on file. She reports that she does not drink alcohol or use illicit drugs.  Family History: Family History  Problem Relation Age of Onset  . Hypertension Mother   . Cancer Mother     colon  . Stroke Father   . Heart disease Father   . Diabetes Brother     Physical Exam: Filed Vitals:   08/25/13 2116 08/25/13 2118 08/25/13 2121 08/25/13 2130  BP: 122/79 136/74 123/66 127/66  Pulse: 80 112 97 71  Temp:      TempSrc:      Resp: 21   17  Height:      Weight:      SpO2: 96%   99%   General appearance: alert, cooperative and no distress Head: Normocephalic, without obvious abnormality, atraumatic Eyes: negative Nose: Nares normal. Septum midline.  Mucosa normal. No drainage or sinus tenderness. Neck: no JVD and supple, symmetrical, trachea midline Lungs: clear to auscultation bilaterally Heart: regular rate and rhythm, S1, S2 normal, no murmur, click, rub or gallop Abdomen: soft, non-tender; bowel sounds normal; no masses,  no organomegaly Extremities: extremities normal, atraumatic, no cyanosis or edema Pulses: 2+ and symmetric Skin: Skin color, texture, turgor normal. No rashes or lesions Neurologic: Grossly normal pleasantly demented  Labs on Admission:   Recent Labs  08/25/13 2033  NA 143  K 3.5*  CL 104  CO2 25  GLUCOSE 142*  BUN 22  CREATININE 1.06  CALCIUM 8.8    Recent Labs  08/25/13 2033  WBC 9.7  HGB 14.4  HCT 43.0  MCV 96.0  PLT 189   Radiological Exams on Admission: Dg Chest Port 1 View  08/25/2013   CLINICAL DATA:  Chest pain. Current history of  treated hypothyroidism, labile hypertension. Prior history of breast cancer post mastectomy.  EXAM: PORTABLE CHEST - 1 VIEW  COMPARISON:  CHEST-2V dated 05/26/2007; DG CHEST 2 VIEW dated 04/14/2005; DG CHEST 2 VIEW dated 04/02/2004; DG CHEST 2 VIEW dated 04/17/2002  FINDINGS: Cardiac silhouette moderately to markedly enlarged but stable. Calcified granuloma in the right upper lobe, unchanged. Emphysematous changes throughout both lungs with hyperinflation, unchanged. Lungs otherwise clear. No localized airspace consolidation. No pleural effusions. No pneumothorax. Normal pulmonary vascularity.  IMPRESSION: COPD/emphysema. No acute cardiopulmonary disease. Stable moderate to marked cardiomegaly.   Electronically Signed   By: Evangeline Dakin M.D.   On: 08/25/2013 21:12    Assessment/Plan  79 yo female with syncopal due to significant arrythmia appears like aflutter with no ventricular response while in ambulance spontaneously resolved  Principal Problem:   Syncope-  Have asked cardiology to evaluate tonight.  Have pacer pads at bedside.  May need pacemaker.  Active Problems:  Stable unless o/w noted   Breast cancer   Hyperlipidemia   SDAT (senile dementia of Alzheimer's type)  Have discussed with cardiologist on call in Ed, will change bed to stepdown for the night after we both reviewed the strips while in EMS.    Petro Talent A Shanon Brow 08/25/2013, 9:52 PM

## 2013-08-26 ENCOUNTER — Encounter (HOSPITAL_COMMUNITY): Payer: Self-pay | Admitting: Internal Medicine

## 2013-08-26 DIAGNOSIS — I4892 Unspecified atrial flutter: Secondary | ICD-10-CM | POA: Diagnosis not present

## 2013-08-26 DIAGNOSIS — I469 Cardiac arrest, cause unspecified: Secondary | ICD-10-CM | POA: Diagnosis not present

## 2013-08-26 DIAGNOSIS — F0280 Dementia in other diseases classified elsewhere without behavioral disturbance: Secondary | ICD-10-CM | POA: Diagnosis not present

## 2013-08-26 DIAGNOSIS — I498 Other specified cardiac arrhythmias: Secondary | ICD-10-CM | POA: Diagnosis not present

## 2013-08-26 DIAGNOSIS — E039 Hypothyroidism, unspecified: Secondary | ICD-10-CM

## 2013-08-26 DIAGNOSIS — C50919 Malignant neoplasm of unspecified site of unspecified female breast: Secondary | ICD-10-CM | POA: Diagnosis not present

## 2013-08-26 DIAGNOSIS — R55 Syncope and collapse: Secondary | ICD-10-CM | POA: Diagnosis not present

## 2013-08-26 DIAGNOSIS — I10 Essential (primary) hypertension: Secondary | ICD-10-CM | POA: Diagnosis not present

## 2013-08-26 LAB — BASIC METABOLIC PANEL
BUN: 19 mg/dL (ref 6–23)
CO2: 23 mEq/L (ref 19–32)
Calcium: 8.3 mg/dL — ABNORMAL LOW (ref 8.4–10.5)
Chloride: 105 mEq/L (ref 96–112)
Creatinine, Ser: 0.87 mg/dL (ref 0.50–1.10)
GFR calc Af Amer: 73 mL/min — ABNORMAL LOW (ref >90)
GFR calc non Af Amer: 63 mL/min — ABNORMAL LOW (ref >90)
GLUCOSE: 122 mg/dL — AB (ref 70–99)
POTASSIUM: 4.1 meq/L (ref 3.7–5.3)
SODIUM: 140 meq/L (ref 137–147)

## 2013-08-26 LAB — TROPONIN I
Troponin I: <0.3 ng/mL (ref <0.30)
Troponin I: <0.3 ng/mL (ref <0.30)
Troponin I: <0.3 ng/mL (ref <0.30)

## 2013-08-26 LAB — CBC
HCT: 38.6 % (ref 36.0–46.0)
Hemoglobin: 12.9 g/dL (ref 12.0–15.0)
MCH: 32.8 pg (ref 26.0–34.0)
MCHC: 33.4 g/dL (ref 30.0–36.0)
MCV: 98.2 fL (ref 78.0–100.0)
Platelets: 161 10*3/uL (ref 150–400)
RBC: 3.93 MIL/uL (ref 3.87–5.11)
RDW: 13.5 % (ref 11.5–15.5)
WBC: 6.8 10*3/uL (ref 4.0–10.5)

## 2013-08-26 LAB — MRSA PCR SCREENING: MRSA by PCR: NEGATIVE

## 2013-08-26 MED ORDER — SODIUM CHLORIDE 0.9 % IJ SOLN
3.0000 mL | Freq: Two times a day (BID) | INTRAMUSCULAR | Status: DC
  Administered 2013-08-26 – 2013-08-28 (×7): 3 mL via INTRAVENOUS

## 2013-08-26 MED ORDER — DONEPEZIL HCL 10 MG PO TABS
10.0000 mg | ORAL_TABLET | Freq: Every day | ORAL | Status: DC
  Filled 2013-08-26 (×2): qty 1

## 2013-08-26 MED ORDER — CALCIUM CARBONATE ANTACID 500 MG PO CHEW
2.0000 | CHEWABLE_TABLET | Freq: Two times a day (BID) | ORAL | Status: DC
  Administered 2013-08-26 – 2013-08-29 (×7): 400 mg via ORAL
  Filled 2013-08-26 (×9): qty 2

## 2013-08-26 MED ORDER — ASPIRIN 81 MG PO CHEW
81.0000 mg | CHEWABLE_TABLET | Freq: Every day | ORAL | Status: DC
  Administered 2013-08-26 – 2013-08-27 (×2): 81 mg via ORAL
  Filled 2013-08-26 (×3): qty 1

## 2013-08-26 MED ORDER — SODIUM CHLORIDE 0.9 % IJ SOLN: 3.0000 mL | INTRAMUSCULAR | Status: DC | PRN

## 2013-08-26 MED ORDER — SODIUM CHLORIDE 0.9 % IV SOLN: 250.0000 mL | INTRAVENOUS | Status: DC | PRN

## 2013-08-26 MED ORDER — LEVOTHYROXINE SODIUM 50 MCG PO TABS
50.0000 ug | ORAL_TABLET | Freq: Every day | ORAL | Status: DC
  Administered 2013-08-26 – 2013-08-29 (×4): 50 ug via ORAL
  Filled 2013-08-26 (×5): qty 1

## 2013-08-26 MED ORDER — SODIUM CHLORIDE 0.9 % IJ SOLN
3.0000 mL | Freq: Two times a day (BID) | INTRAMUSCULAR | Status: DC
  Administered 2013-08-26 – 2013-08-28 (×4): 3 mL via INTRAVENOUS

## 2013-08-26 NOTE — ED Notes (Signed)
Pacer pads placed on patient on arrival.

## 2013-08-26 NOTE — Consult Note (Signed)
Primary Care Physician: Alesia Richards, MD Referring Physician:  Dr Sheffield Slider is a 79 y.o. female with a h/o dementia who is admitted with syncope.  Her husband reports that they had travelled home from [place] yesterday and were both tired.  They went into the house and he was in another room when he heard a "thump".  He went into her room to find her unresponsive.  The patient does not recall any symptoms prior to this event.  She woke but was confused.  She apparently had another episode witnessed by EMS.  Per report, she was observed to have a 6 second pause in the setting of an atrial arrhythmia however these strips are presently not available.  She has been in sinus rhythm here, without any further documented arrhythmias.  Her Aricept has been placed on hold.  Today, she denies symptoms of palpitations, chest pain, shortness of breath, orthopnea, PND, lower extremity edema, dizziness, presyncope, syncope, or neurologic sequela. The patient is tolerating medications without difficulties and is otherwise without complaint today.   Past Medical History  Diagnosis Date  . Labile hypertension   . Hypothyroid   . IBS (irritable bowel syndrome)   . Vitamin D deficiency   . Breast cancer 1970    Right  . Dementia     moderate   Past Surgical History  Procedure Laterality Date  . Mastectomy      right 1978 left 1980  . Cholecystectomy      1988  . Biopsy thyroid      1999    Current Facility-Administered Medications  Medication Dose Route Frequency Provider Last Rate Last Dose  . 0.9 %  sodium chloride infusion  250 mL Intravenous PRN Phillips Grout, MD      . aspirin chewable tablet 81 mg  81 mg Oral Daily Phillips Grout, MD   81 mg at 08/26/13 1059  . calcium carbonate (TUMS - dosed in mg elemental calcium) chewable tablet 400 mg of elemental calcium  2 tablet Oral BID Phillips Grout, MD   400 mg of elemental calcium at 08/26/13 1059  . levothyroxine (SYNTHROID,  LEVOTHROID) tablet 50 mcg  50 mcg Oral QAC breakfast Phillips Grout, MD   50 mcg at 08/26/13 0855  . sodium chloride 0.9 % injection 3 mL  3 mL Intravenous Q12H Phillips Grout, MD   3 mL at 08/26/13 1059  . sodium chloride 0.9 % injection 3 mL  3 mL Intravenous Q12H Phillips Grout, MD   3 mL at 08/26/13 1059  . sodium chloride 0.9 % injection 3 mL  3 mL Intravenous PRN Phillips Grout, MD        No Known Allergies  History   Social History  . Marital Status: Married    Spouse Name: N/A    Number of Children: N/A  . Years of Education: N/A   Occupational History  . Not on file.   Social History Main Topics  . Smoking status: Never Smoker   . Smokeless tobacco: Not on file  . Alcohol Use: No  . Drug Use: No  . Sexual Activity: Not on file   Other Topics Concern  . Not on file   Social History Narrative   Lives in [place]    Family History  Problem Relation Age of Onset  . Hypertension Mother   . Cancer Mother     colon  . Stroke Father   . Heart  disease Father   . Diabetes Brother     ROS- All systems are reviewed and negative except as per the HPI above  Physical Exam: Filed Vitals:   08/26/13 0700 08/26/13 0833 08/26/13 0850 08/26/13 1140  BP: 102/52 111/64  107/55  Pulse: 64 62 74 60  Temp:  98 F (36.7 C)  97.8 F (36.6 C)  TempSrc:  Oral  Oral  Resp: 19 21 17 26   Height:      Weight:      SpO2: 98% 98% 99% 99%    GEN- The patient is thin and elderly appearing, alert and oriented x 3 today.   Head- normocephalic, atraumatic Eyes-  Sclera clear, conjunctiva pink Ears- hearing intact Oropharynx- clear Neck- supple  Lungs- Clear to ausculation bilaterally, normal work of breathing Heart- Regular rate and rhythm, no murmurs, rubs or gallops, PMI not laterally displaced GI- soft, NT, ND, + BS Extremities- no clubbing, cyanosis, or edema MS- no significant deformity or atrophy Skin- no rash or lesion Psych- euthymic mood, full affect, she has  trouble with recent memory recall and providing history Neuro- strength and sensation are intact  EKG 08/25/13 reveals sinus rhythm 81 bpm, PR 174, QRS 98, Qtc 468 Telemetry reveals sinus rhythm with no AV block, pauses, or arrhythmias  Assessment and Plan:   1. Syncope Unclear etiology Per history, this could be bradycardia induced.   At this time, I will try to obtain records from [facility].  I did call their office today but did not receive an answer.  I will call them tomorrow am to try to obtain necessary strips from her arrhythmic event.  This is required before I can make any real comments/ recommendations regarding her management.  Her Aricept has been held.  This appears reasonable. I will also obtain an echo to evaluate for structural heart disease.  2. Bradycardia As above  3. Dementia aricept is on hold  Would keep on telemetry while here. I will see again tomorrow after records from EMS are available.

## 2013-08-26 NOTE — ED Notes (Signed)
Attempted to call report to 2C. 

## 2013-08-26 NOTE — ED Notes (Signed)
Explained to husband that the level of care has been changed, and that delay is due to new room assignment.

## 2013-08-26 NOTE — ED Notes (Signed)
Called bed control and spoke with hospitalist, patient's level of care has been changed to step down.

## 2013-08-26 NOTE — Progress Notes (Signed)
[facility] PROGRESS NOTE IAssessment/Plan: Syncope: - Clear pause of at least 6 seconds. She is on Aricept. - No events on telemetry. - No nodal blocking agents, cardiac markers negative till date. TSH & Echo pending  SDAT (senile dementia of Alzheimer's type): - she is no Aricept which can cause bradycardia and can cause nodal blocking. - will hold.  Hyperlipidemia: - statins.  Breast cancer: - follow up as an outpatient.  Code Status: full Family Communication: none  Disposition Plan: inpatient   Consultants:  Cardiology  Procedures:  echo  Antibiotics:  None  HPI/Subjective: No compalins  Objective: Filed Vitals:   08/26/13 0200 08/26/13 0459 08/26/13 0500 08/26/13 0600  BP: 147/120 116/67 115/55 109/64  Pulse: 79 64 63 60  Temp:  98.3 F (36.8 C)    TempSrc:  Oral    Resp: 29 15 23 16   Height:      Weight:      SpO2: 100% 98% 98% 99%    Intake/Output Summary (Last 24 hours) at 08/26/13 0739 Last data filed at 08/26/13 0400  Gross per 24 hour  Intake   1000 ml  Output    250 ml  Net    750 ml   Filed Weights   08/25/13 2029  Weight: 58.968 kg (130 lb)    Exam:  General: Alert, awake, oriented x3, in no acute distress.  HEENT: No bruits, no goiter.  Heart: Regular rate and rhythm, without murmurs, rubs, gallops.  Lungs: Good air movement, clear Abdomen: Soft, nontender, nondistended, positive bowel sounds.     Data Reviewed: Basic Metabolic Panel:  Recent Labs Lab 08/25/13 2033 08/26/13 0345  NA 143 140  K 3.5* 4.1  CL 104 105  CO2 25 23  GLUCOSE 142* 122*  BUN 22 19  CREATININE 1.06 0.87  CALCIUM 8.8 8.3*   Liver Function Tests: No results found for this basename: AST, ALT, ALKPHOS, BILITOT, PROT, ALBUMIN,  in the last 168 hours No results found for this basename: LIPASE, AMYLASE,  in the last 168 hours No results found for this basename: AMMONIA,  in the last 168 hours CBC:  Recent Labs Lab 08/25/13 2033  08/26/13 0345  WBC 9.7 6.8  HGB 14.4 12.9  HCT 43.0 38.6  MCV 96.0 98.2  PLT 189 161   Cardiac Enzymes:  Recent Labs Lab 08/26/13 0345  TROPONINI <0.30   BNP (last 3 results) No results found for this basename: PROBNP,  in the last 8760 hours CBG:  Recent Labs Lab 08/25/13 2112  GLUCAP 114*    Recent Results (from the past 240 hour(s))  MRSA PCR SCREENING     Status: None   Collection Time    08/26/13  2:03 AM      Result Value Ref Range Status   MRSA by PCR NEGATIVE  NEGATIVE Final   Comment:            The GeneXpert MRSA Assay (FDA     approved for NASAL specimens     only), is one component of a     comprehensive MRSA colonization     surveillance program. It is not     intended to diagnose MRSA     infection nor to guide or     monitor treatment for     MRSA infections.     Studies: Ct Angio Chest Pe W/cm &/or Wo Cm  08/25/2013   CLINICAL DATA:  Syncope.  Loss of consciousness.  Seizure.  EXAM:  CT ANGIOGRAPHY CHEST WITH CONTRAST  TECHNIQUE: Multidetector CT imaging of the chest was performed using the standard protocol during bolus administration of intravenous contrast. Multiplanar CT image reconstructions and MIPs were obtained to evaluate the vascular anatomy.  CONTRAST:  65mL OMNIPAQUE IOHEXOL 350 MG/ML SOLN  COMPARISON:  DG CHEST 1V PORT dated 08/25/2013  FINDINGS: Bones: Mild thoracic spondylosis and scattered Schmorl's nodes. No aggressive osseous lesions.  : Lungs: Bilateral pleural apical scarring. Emphysema. Old granulomatous disease is present with large calcified granuloma in the superior aspect of the right middle lobe. Dependent atelectasis is present in the left lower lobe. No airspace disease.  Central airways: Patent.  Vasculature: Technically adequate study. No pulmonary embolism. Aortic atherosclerosis.  Effusions: Moderate pericardial effusion. Effusion measures 2 cm in thickness along the inferior surface of the heart.  Lymphadenopathy: No axillary  adenopathy. No mediastinal or hilar adenopathy. Calcified lymph nodes associated with old granulomatous disease.  Esophagus: Normal.  Upper abdomen: Old granulomatous disease of the spleen.  Other: Calcified nodule in the right thyroid lobe.  Review of the MIP images confirms the above findings.  IMPRESSION: 1. Negative for pulmonary embolism or acute aortic abnormality. 2. Emphysema and old granulomatous disease. 3. Moderate pericardial effusion.   Electronically Signed   By: Dereck Ligas M.D.   On: 08/25/2013 23:58   Dg Chest Port 1 View  08/25/2013   CLINICAL DATA:  Chest pain. Current history of treated hypothyroidism, labile hypertension. Prior history of breast cancer post mastectomy.  EXAM: PORTABLE CHEST - 1 VIEW  COMPARISON:  CHEST-2V dated 05/26/2007; DG CHEST 2 VIEW dated 04/14/2005; DG CHEST 2 VIEW dated 04/02/2004; DG CHEST 2 VIEW dated 04/17/2002  FINDINGS: Cardiac silhouette moderately to markedly enlarged but stable. Calcified granuloma in the right upper lobe, unchanged. Emphysematous changes throughout both lungs with hyperinflation, unchanged. Lungs otherwise clear. No localized airspace consolidation. No pleural effusions. No pneumothorax. Normal pulmonary vascularity.  IMPRESSION: COPD/emphysema. No acute cardiopulmonary disease. Stable moderate to marked cardiomegaly.   Electronically Signed   By: Evangeline Dakin M.D.   On: 08/25/2013 21:12    Scheduled Meds: . aspirin  81 mg Oral Daily  . calcium carbonate  2 tablet Oral BID  . donepezil  10 mg Oral QHS  . levothyroxine  50 mcg Oral QAC breakfast  . sodium chloride  3 mL Intravenous Q12H  . sodium chloride  3 mL Intravenous Q12H   Continuous Infusions:    [facility] Pager [phone]. If 8PM-8AM, please contact night-coverage at [email or web address], password [facility] 08/26/2013, 7:39 AM  LOS: 1 day

## 2013-08-27 DIAGNOSIS — F0280 Dementia in other diseases classified elsewhere without behavioral disturbance: Secondary | ICD-10-CM | POA: Diagnosis not present

## 2013-08-27 DIAGNOSIS — I469 Cardiac arrest, cause unspecified: Secondary | ICD-10-CM | POA: Diagnosis not present

## 2013-08-27 DIAGNOSIS — I4892 Unspecified atrial flutter: Secondary | ICD-10-CM | POA: Diagnosis not present

## 2013-08-27 DIAGNOSIS — R55 Syncope and collapse: Secondary | ICD-10-CM | POA: Diagnosis not present

## 2013-08-27 DIAGNOSIS — C50919 Malignant neoplasm of unspecified site of unspecified female breast: Secondary | ICD-10-CM | POA: Diagnosis not present

## 2013-08-27 LAB — GLUCOSE, CAPILLARY
GLUCOSE-CAPILLARY: 118 mg/dL — AB (ref 70–99)
Glucose-Capillary: 134 mg/dL — ABNORMAL HIGH (ref 70–99)
Glucose-Capillary: 109 mg/dL — ABNORMAL HIGH (ref 70–99)
Glucose-Capillary: 144 mg/dL — ABNORMAL HIGH (ref 70–99)

## 2013-08-27 MED ORDER — SODIUM CHLORIDE 0.9 % IV SOLN: INTRAVENOUS | Status: DC

## 2013-08-27 MED ORDER — METOPROLOL TARTRATE 1 MG/ML IV SOLN
2.5000 mg | INTRAVENOUS | Status: DC | PRN
  Filled 2013-08-27: qty 5

## 2013-08-27 MED ORDER — APIXABAN 5 MG PO TABS
5.0000 mg | ORAL_TABLET | Freq: Two times a day (BID) | ORAL | Status: DC
  Administered 2013-08-27 – 2013-08-28 (×3): 5 mg via ORAL
  Filled 2013-08-27 (×5): qty 1

## 2013-08-27 NOTE — Progress Notes (Signed)
UR completed. Raveen Wieseler RN CCM Case Mgmt phone [phone] 

## 2013-08-27 NOTE — Progress Notes (Signed)
SUBJECTIVE: The patient is doing well today.  At this time, she denies chest pain, shortness of breath, or any new concerns.  She has returned to atrial flutter with 2:1 conduction.  Marland Kitchen apixaban  5 mg Oral BID  . calcium carbonate  2 tablet Oral BID  . levothyroxine  50 mcg Oral QAC breakfast  . sodium chloride  3 mL Intravenous Q12H  . sodium chloride  3 mL Intravenous Q12H      OBJECTIVE: Physical Exam: Filed Vitals:   08/27/13 0645 08/27/13 0845 08/27/13 1142 08/27/13 1500  BP: 104/66 113/52 99/60 105/78  Pulse: 70 134 72 111  Temp:  98.3 F (36.8 C) 98 F (36.7 C)   TempSrc:  Oral Oral   Resp: 17 21 20 19   Height:      Weight:      SpO2: 94%  99% 98%    Intake/Output Summary (Last 24 hours) at 08/27/13 1608 Last data filed at 08/27/13 1400  Gross per 24 hour  Intake    480 ml  Output    550 ml  Net    -70 ml    Telemetry reveals atrial flutter with 2:1 AV conduction  GEN- The patient is well appearing, alert but confused Head- normocephalic, atraumatic Eyes-  Sclera clear, conjunctiva pink Ears- hearing intact Oropharynx- clear Neck- supple  Lungs- Clear to ausculation bilaterally, normal work of breathing Heart- irregular rate and rhythm  GI- soft, NT, ND, + BS Extremities- no clubbing, cyanosis, or edema Skin- no rash or lesion Psych- alert Neuro- strength and sensation are intact  LABS: Basic Metabolic Panel:  Recent Labs  08/25/13 2033 08/26/13 0345  NA 143 140  K 3.5* 4.1  CL 104 105  CO2 25 23  GLUCOSE 142* 122*  BUN 22 19  CREATININE 1.06 0.87  CALCIUM 8.8 8.3*   Liver Function Tests: No results found for this basename: AST, ALT, ALKPHOS, BILITOT, PROT, ALBUMIN,  in the last 72 hours No results found for this basename: LIPASE, AMYLASE,  in the last 72 hours CBC:  Recent Labs  08/25/13 2033 08/26/13 0345  WBC 9.7 6.8  HGB 14.4 12.9  HCT 43.0 38.6  MCV 96.0 98.2  PLT 189 161   Cardiac Enzymes:  Recent Labs   08/26/13 0345 08/26/13 0839 08/26/13 1356  TROPONINI <0.30 <0.30 <0.30   BNP: No components found with this basename: POCBNP,  D-Dimer: No results found for this basename: DDIMER,  in the last 72 hours Hemoglobin A1C: No results found for this basename: HGBA1C,  in the last 72 hours Fasting Lipid Panel: No results found for this basename: CHOL, HDL, LDLCALC, TRIG, CHOLHDL, LDLDIRECT,  in the last 72 hours Thyroid Function Tests: No results found for this basename: TSH, T4TOTAL, FREET3, T3FREE, THYROIDAB,  in the last 72 hours Anemia Panel: No results found for this basename: VITAMINB12, FOLATE, FERRITIN, TIBC, IRON, RETICCTPCT,  in the last 72 hours  RADIOLOGY: Ct Angio Chest Pe W/cm &/or Wo Cm  08/25/2013   CLINICAL DATA:  Syncope.  Loss of consciousness.  Seizure.  EXAM: CT ANGIOGRAPHY CHEST WITH CONTRAST  TECHNIQUE: Multidetector CT imaging of the chest was performed using the standard protocol during bolus administration of intravenous contrast. Multiplanar CT image reconstructions and MIPs were obtained to evaluate the vascular anatomy.  CONTRAST:  56mL OMNIPAQUE IOHEXOL 350 MG/ML SOLN  COMPARISON:  DG CHEST 1V PORT dated 08/25/2013  FINDINGS: Bones: Mild thoracic spondylosis and scattered Schmorl's nodes. No aggressive osseous lesions.  :  Lungs: Bilateral pleural apical scarring. Emphysema. Old granulomatous disease is present with large calcified granuloma in the superior aspect of the right middle lobe. Dependent atelectasis is present in the left lower lobe. No airspace disease.  Central airways: Patent.  Vasculature: Technically adequate study. No pulmonary embolism. Aortic atherosclerosis.  Effusions: Moderate pericardial effusion. Effusion measures 2 cm in thickness along the inferior surface of the heart.  Lymphadenopathy: No axillary adenopathy. No mediastinal or hilar adenopathy. Calcified lymph nodes associated with old granulomatous disease.  Esophagus: Normal.  Upper abdomen: Old  granulomatous disease of the spleen.  Other: Calcified nodule in the right thyroid lobe.  Review of the MIP images confirms the above findings.  IMPRESSION: 1. Negative for pulmonary embolism or acute aortic abnormality. 2. Emphysema and old granulomatous disease. 3. Moderate pericardial effusion.   Electronically Signed   By: Geoffrey  Lamke M.D.   On: 08/25/2013 23:58   Dg Chest Port 1 View  08/25/2013   CLINICAL DATA:  Chest pain. Current history of treated hypothyroidism, labile hypertension. Prior history of breast cancer post mastectomy.  EXAM: PORTABLE CHEST - 1 VIEW  COMPARISON:  CHEST-2V dated 05/26/2007; DG CHEST 2 VIEW dated 04/14/2005; DG CHEST 2 VIEW dated 04/02/2004; DG CHEST 2 VIEW dated 04/17/2002  FINDINGS: Cardiac silhouette moderately to markedly enlarged but stable. Calcified granuloma in the right upper lobe, unchanged. Emphysematous changes throughout both lungs with hyperinflation, unchanged. Lungs otherwise clear. No localized airspace consolidation. No pleural effusions. No pneumothorax. Normal pulmonary vascularity.  IMPRESSION: COPD/emphysema. No acute cardiopulmonary disease. Stable moderate to marked cardiomegaly.   Electronically Signed   By: Thomas  Lawrence M.D.   On: 08/25/2013 21:12    ASSESSMENT AND PLAN:  Principal Problem:   Syncope Active Problems:   Breast cancer   Hyperlipidemia   SDAT (senile dementia of Alzheimer's type)   Pulseless electrical activity  1. Syncope I have been able to obtain records from [facility] which reveal that she did have atrial flutter with ventricular stand still and pauses of > 6 seconds.  In sinus, her AV conduction appears to be good. I think that we should proceed with atrial flutter ablation.  During EP study, we can evaluate her his-purkinje system with intracardiac measurements to see if pacemaker would be recommended.  I would anticipate that if we ablate her atrial flutter that we can avoid PPM long term.  2.  Typical appearing atrial flutter She is in atrial flutter with 2:1 conduction.  She appears to be tolerating this well.  I would be reluctant to aggressively rate control her presently given her previously documented ventricular stand still.  I think that ablation is the most prudent step at this time.  She has been in atrial flutter for < 25 hours and has been initiated on eliquis.  I do not anticipate that she would require TEE if her ablation is performed tomorrow.  I had hoped to perform the procedure today but unfortunately she has already eaten. Therapeutic strategies for atrial flutter including medicine and ablation were discussed in detail with the patient and her spouse (POA) today. Risk, benefits, and alternatives to EP study and radiofrequency ablation were also discussed in detail today. These risks include but are not limited to stroke, bleeding, vascular damage, tamponade, perforation, damage to the heart and other structures, AV block requiring pacemaker, worsening renal function, and death. They understand these risk and wishes to proceed.  We will therefore proceed with catheter ablation at the next available time.    I have placed on Dr Taylors schedule for tomorrow am.     Georgeanna Radziewicz, MD 08/27/2013 4:08 PM  

## 2013-08-27 NOTE — Progress Notes (Signed)
Note: This document was prepared with digital dictation and possible smart phrase technology. Any transcriptional errors that result from this process are unintentional.   Amber Chan GMW:[id number] DOB: [birth year] DOA: 08/25/2013 PCP: Alesia Richards, MD  Brief narrative: 79 y/o ?, known h/o Senile dementia, HLD, prior Breast cancer c Mastectomy '78, '80 [left], hypothyroidism with benign cyst noted in past [2002], Irritable bowel syndrome admitted from home with syncopal episode with clear 6 second pause and was out for about 45 secs-found to be in a flutter and admitted to [facility] Cardiology consulted  Past medical history-As per Problem list Chart reviewed as below- reviewed  Consultants:   cardiology  EP  Procedures:  none  Antibiotics:  none   Subjective  Alert, pleasant no cp or sob or subj feeling palpitations HR noted 130-145   Objective    Interim History: none  Telemetry: Reviewed-A flutter 3:1.  Rate 140   Objective: Filed Vitals:   08/27/13 0345 08/27/13 0500 08/27/13 0645 08/27/13 0845  BP: 114/60  104/66 113/52  Pulse: 69 78 70 134  Temp: 98.4 F (36.9 C)   98.3 F (36.8 C)  TempSrc: Oral   Oral  Resp: 20 17 17 21   Height:      Weight:  54.3 kg (119 lb 11.4 oz)    SpO2: 100% 88% 94%     Intake/Output Summary (Last 24 hours) at 08/27/13 1021 Last data filed at 08/27/13 0900  Gross per 24 hour  Intake    360 ml  Output    600 ml  Net   -240 ml    Exam:  General: alert, pleasant , nad, no ict, pallor Cardiovascular: s1 s2 tachy, irreg irreg Respiratory: clear no added sound Abdomen: soft, nt, nd Skin no le edema Neuro intact  Data Reviewed: Basic Metabolic Panel:  Recent Labs Lab 08/25/13 2033 08/26/13 0345  NA 143 140  K 3.5* 4.1  CL 104 105  CO2 25 23  GLUCOSE 142* 122*  BUN 22 19  CREATININE 1.06 0.87  CALCIUM 8.8 8.3*   Liver Function Tests: No results found for this basename: AST, ALT, ALKPHOS,  BILITOT, PROT, ALBUMIN,  in the last 168 hours No results found for this basename: LIPASE, AMYLASE,  in the last 168 hours No results found for this basename: AMMONIA,  in the last 168 hours CBC:  Recent Labs Lab 08/25/13 2033 08/26/13 0345  WBC 9.7 6.8  HGB 14.4 12.9  HCT 43.0 38.6  MCV 96.0 98.2  PLT 189 161   Cardiac Enzymes:  Recent Labs Lab 08/26/13 0345 08/26/13 0839 08/26/13 1356  TROPONINI <0.30 <0.30 <0.30   BNP: No components found with this basename: POCBNP,  CBG:  Recent Labs Lab 08/25/13 2112 08/27/13 0843  GLUCAP 114* 134*    Recent Results (from the past 240 hour(s))  MRSA PCR SCREENING     Status: None   Collection Time    08/26/13  2:03 AM      Result Value Ref Range Status   MRSA by PCR NEGATIVE  NEGATIVE Final   Comment:            The GeneXpert MRSA Assay (FDA     approved for NASAL specimens     only), is one component of a     comprehensive MRSA colonization     surveillance program. It is not     intended to diagnose MRSA     infection nor to guide or  monitor treatment for     MRSA infections.     Studies:              All Imaging reviewed and is as per above notation   Scheduled Meds: . apixaban  5 mg Oral BID  . aspirin  81 mg Oral Daily  . calcium carbonate  2 tablet Oral BID  . levothyroxine  50 mcg Oral QAC breakfast  . sodium chloride  3 mL Intravenous Q12H  . sodium chloride  3 mL Intravenous Q12H   Continuous Infusions:    Assessment/Plan: 1. P Aflutter-Discussed with cardiology-will see patient-might need cardioversion.   2. Mild Senile dementia-re-orients well.  Knows where she is, and whats going on 3. H/o br Ca s/p bilateral mastectomy-stable currently-needs OP further screening per ONc 4. Hypothyroidism had TSH 08/13/13 which was 2.1, ruling out obsious causes such as hyper thyroidism 5. HLd-stable.  Not sure if has mortality benefit for a statin  Code Status: FULL  Family Communication: discussed at  bedsdie with husband Disposition Plan: keep SDU--further plans as per Cardiology   Verneita Griffes, MD  [facility] Pager [phone] 08/27/2013, 10:21 AM    LOS: 2 days

## 2013-08-27 NOTE — Progress Notes (Signed)
ANTICOAGULATION CONSULT NOTE - Initial Consult  Pharmacy Consult for apixaban Indication: atrial fibrillation  No Known Allergies  Patient Measurements: Height: 5\' 7"  (170.2 cm) Weight: 119 lb 11.4 oz (54.3 kg) IBW/kg (Calculated) : 61.6   Vital Signs: Temp: 98.3 F (36.8 C) (05/11 0845) Temp src: Oral (05/11 0845) BP: 113/52 mmHg (05/11 0845) Pulse Rate: 134 (05/11 0845)  Labs:  Recent Labs  08/25/13 2033 08/25/13 2223 08/26/13 0345 08/26/13 0839 08/26/13 1356  HGB 14.4  --  12.9  --   --   HCT 43.0  --  38.6  --   --   PLT 189  --  161  --   --   LABPROT  --  13.7  --   --   --   INR  --  1.07  --   --   --   CREATININE 1.06  --  0.87  --   --   TROPONINI  --   --  <0.30 <0.30 <0.30    Estimated Creatinine Clearance: 47.2 ml/min (by C-G formula based on Cr of 0.87).   Medical History: Past Medical History  Diagnosis Date  . Labile hypertension   . Hypothyroid   . IBS (irritable bowel syndrome)   . Vitamin D deficiency   . Breast cancer 1970    Right  . Dementia     moderate    Medications:  See med rec  Assessment: Patient is a  79 y.o F admitted on 5/9 secondary to syncope.  To start apixaban today for Afib (CHADSVASC 3).   Plan:  1) apixaban 5mg  PO BID  Jacquline Terrill P Candon Caras 08/27/2013,10:00 AM

## 2013-08-28 ENCOUNTER — Inpatient Hospital Stay (HOSPITAL_COMMUNITY): Payer: Medicare Other

## 2013-08-28 ENCOUNTER — Encounter (HOSPITAL_COMMUNITY)
Admission: EM | Disposition: A | Discharge status: Home / Self Care | Payer: Medicare Other | Source: Home / Self Care | Attending: Family Medicine

## 2013-08-28 DIAGNOSIS — I4892 Unspecified atrial flutter: Secondary | ICD-10-CM | POA: Diagnosis not present

## 2013-08-28 DIAGNOSIS — I319 Disease of pericardium, unspecified: Secondary | ICD-10-CM

## 2013-08-28 DIAGNOSIS — J438 Other emphysema: Secondary | ICD-10-CM | POA: Diagnosis not present

## 2013-08-28 HISTORY — PX: ATRIAL FLUTTER ABLATION: SHX5733

## 2013-08-28 HISTORY — PX: ABLATION: SHX5711

## 2013-08-28 LAB — COMPREHENSIVE METABOLIC PANEL
ALT: 21 U/L (ref 0–35)
AST: 28 U/L (ref 0–37)
Albumin: 2.9 g/dL — ABNORMAL LOW (ref 3.5–5.2)
Alkaline Phosphatase: 67 U/L (ref 39–117)
BUN: 16 mg/dL (ref 6–23)
CALCIUM: 8.5 mg/dL (ref 8.4–10.5)
CO2: 26 mEq/L (ref 19–32)
Chloride: 104 mEq/L (ref 96–112)
Creatinine, Ser: 0.71 mg/dL (ref 0.50–1.10)
GFR calc non Af Amer: 82 mL/min — ABNORMAL LOW (ref >90)
Glucose, Bld: 99 mg/dL (ref 70–99)
Potassium: 3.6 mEq/L — ABNORMAL LOW (ref 3.7–5.3)
SODIUM: 142 meq/L (ref 137–147)
TOTAL PROTEIN: 6.1 g/dL (ref 6.0–8.3)
Total Bilirubin: 0.8 mg/dL (ref 0.3–1.2)

## 2013-08-28 LAB — GLUCOSE, CAPILLARY: Glucose-Capillary: 102 mg/dL — ABNORMAL HIGH (ref 70–99)

## 2013-08-28 LAB — MAGNESIUM: MAGNESIUM: 1.7 mg/dL (ref 1.5–2.5)

## 2013-08-28 SURGERY — ATRIAL FLUTTER ABLATION
Anesthesia: LOCAL

## 2013-08-28 MED ORDER — SODIUM CHLORIDE 0.9 % IJ SOLN: 3.0000 mL | Freq: Two times a day (BID) | INTRAMUSCULAR | Status: DC

## 2013-08-28 MED ORDER — SODIUM CHLORIDE 0.9 % IJ SOLN: 3.0000 mL | INTRAMUSCULAR | Status: DC | PRN

## 2013-08-28 MED ORDER — MIDAZOLAM HCL 5 MG/5ML IJ SOLN
INTRAMUSCULAR | Status: AC
  Filled 2013-08-28: qty 5

## 2013-08-28 MED ORDER — SODIUM CHLORIDE 0.9 % IV SOLN: 250.0000 mL | INTRAVENOUS | Status: DC | PRN

## 2013-08-28 MED ORDER — FENTANYL CITRATE 0.05 MG/ML IJ SOLN
INTRAMUSCULAR | Status: AC
  Filled 2013-08-28: qty 2

## 2013-08-28 MED ORDER — HEPARIN (PORCINE) IN NACL 2-0.9 UNIT/ML-% IJ SOLN
INTRAMUSCULAR | Status: AC
  Filled 2013-08-28: qty 500

## 2013-08-28 MED ORDER — BUPIVACAINE HCL (PF) 0.25 % IJ SOLN
INTRAMUSCULAR | Status: AC
  Filled 2013-08-28: qty 60

## 2013-08-28 NOTE — H&P (View-Only) (Signed)
SUBJECTIVE: The patient is doing well today.  At this time, she denies chest pain, shortness of breath, or any new concerns.  She has returned to atrial flutter with 2:1 conduction.  Marland Kitchen apixaban  5 mg Oral BID  . calcium carbonate  2 tablet Oral BID  . levothyroxine  50 mcg Oral QAC breakfast  . sodium chloride  3 mL Intravenous Q12H  . sodium chloride  3 mL Intravenous Q12H      OBJECTIVE: Physical Exam: Filed Vitals:   08/27/13 0645 08/27/13 0845 08/27/13 1142 08/27/13 1500  BP: 104/66 113/52 99/60 105/78  Pulse: 70 134 72 111  Temp:  98.3 F (36.8 C) 98 F (36.7 C)   TempSrc:  Oral Oral   Resp: 17 21 20 19   Height:      Weight:      SpO2: 94%  99% 98%    Intake/Output Summary (Last 24 hours) at 08/27/13 1608 Last data filed at 08/27/13 1400  Gross per 24 hour  Intake    480 ml  Output    550 ml  Net    -70 ml    Telemetry reveals atrial flutter with 2:1 AV conduction  GEN- The patient is well appearing, alert but confused Head- normocephalic, atraumatic Eyes-  Sclera clear, conjunctiva pink Ears- hearing intact Oropharynx- clear Neck- supple  Lungs- Clear to ausculation bilaterally, normal work of breathing Heart- irregular rate and rhythm  GI- soft, NT, ND, + BS Extremities- no clubbing, cyanosis, or edema Skin- no rash or lesion Psych- alert Neuro- strength and sensation are intact  LABS: Basic Metabolic Panel:  Recent Labs  08/25/13 2033 08/26/13 0345  NA 143 140  K 3.5* 4.1  CL 104 105  CO2 25 23  GLUCOSE 142* 122*  BUN 22 19  CREATININE 1.06 0.87  CALCIUM 8.8 8.3*   Liver Function Tests: No results found for this basename: AST, ALT, ALKPHOS, BILITOT, PROT, ALBUMIN,  in the last 72 hours No results found for this basename: LIPASE, AMYLASE,  in the last 72 hours CBC:  Recent Labs  08/25/13 2033 08/26/13 0345  WBC 9.7 6.8  HGB 14.4 12.9  HCT 43.0 38.6  MCV 96.0 98.2  PLT 189 161   Cardiac Enzymes:  Recent Labs   08/26/13 0345 08/26/13 0839 08/26/13 1356  TROPONINI <0.30 <0.30 <0.30   BNP: No components found with this basename: POCBNP,  D-Dimer: No results found for this basename: DDIMER,  in the last 72 hours Hemoglobin A1C: No results found for this basename: HGBA1C,  in the last 72 hours Fasting Lipid Panel: No results found for this basename: CHOL, HDL, LDLCALC, TRIG, CHOLHDL, LDLDIRECT,  in the last 72 hours Thyroid Function Tests: No results found for this basename: TSH, T4TOTAL, FREET3, T3FREE, THYROIDAB,  in the last 72 hours Anemia Panel: No results found for this basename: VITAMINB12, FOLATE, FERRITIN, TIBC, IRON, RETICCTPCT,  in the last 72 hours  RADIOLOGY: Ct Angio Chest Pe W/cm &/or Wo Cm  08/25/2013   CLINICAL DATA:  Syncope.  Loss of consciousness.  Seizure.  EXAM: CT ANGIOGRAPHY CHEST WITH CONTRAST  TECHNIQUE: Multidetector CT imaging of the chest was performed using the standard protocol during bolus administration of intravenous contrast. Multiplanar CT image reconstructions and MIPs were obtained to evaluate the vascular anatomy.  CONTRAST:  56mL OMNIPAQUE IOHEXOL 350 MG/ML SOLN  COMPARISON:  DG CHEST 1V PORT dated 08/25/2013  FINDINGS: Bones: Mild thoracic spondylosis and scattered Schmorl's nodes. No aggressive osseous lesions.  :  Lungs: Bilateral pleural apical scarring. Emphysema. Old granulomatous disease is present with large calcified granuloma in the superior aspect of the right middle lobe. Dependent atelectasis is present in the left lower lobe. No airspace disease.  Central airways: Patent.  Vasculature: Technically adequate study. No pulmonary embolism. Aortic atherosclerosis.  Effusions: Moderate pericardial effusion. Effusion measures 2 cm in thickness along the inferior surface of the heart.  Lymphadenopathy: No axillary adenopathy. No mediastinal or hilar adenopathy. Calcified lymph nodes associated with old granulomatous disease.  Esophagus: Normal.  Upper abdomen: Old  granulomatous disease of the spleen.  Other: Calcified nodule in the right thyroid lobe.  Review of the MIP images confirms the above findings.  IMPRESSION: 1. Negative for pulmonary embolism or acute aortic abnormality. 2. Emphysema and old granulomatous disease. 3. Moderate pericardial effusion.   Electronically Signed   By: Dereck Ligas M.D.   On: 08/25/2013 23:58   Dg Chest Port 1 View  08/25/2013   CLINICAL DATA:  Chest pain. Current history of treated hypothyroidism, labile hypertension. Prior history of breast cancer post mastectomy.  EXAM: PORTABLE CHEST - 1 VIEW  COMPARISON:  CHEST-2V dated 05/26/2007; DG CHEST 2 VIEW dated 04/14/2005; DG CHEST 2 VIEW dated 04/02/2004; DG CHEST 2 VIEW dated 04/17/2002  FINDINGS: Cardiac silhouette moderately to markedly enlarged but stable. Calcified granuloma in the right upper lobe, unchanged. Emphysematous changes throughout both lungs with hyperinflation, unchanged. Lungs otherwise clear. No localized airspace consolidation. No pleural effusions. No pneumothorax. Normal pulmonary vascularity.  IMPRESSION: COPD/emphysema. No acute cardiopulmonary disease. Stable moderate to marked cardiomegaly.   Electronically Signed   By: Evangeline Dakin M.D.   On: 08/25/2013 21:12    ASSESSMENT AND PLAN:  Principal Problem:   Syncope Active Problems:   Breast cancer   Hyperlipidemia   SDAT (senile dementia of Alzheimer's type)   Pulseless electrical activity  1. Syncope I have been able to obtain records from [facility] which reveal that she did have atrial flutter with ventricular stand still and pauses of > 6 seconds.  In sinus, her AV conduction appears to be good. I think that we should proceed with atrial flutter ablation.  During EP study, we can evaluate her his-purkinje system with intracardiac measurements to see if pacemaker would be recommended.  I would anticipate that if we ablate her atrial flutter that we can avoid PPM long term.  2.  Typical appearing atrial flutter She is in atrial flutter with 2:1 conduction.  She appears to be tolerating this well.  I would be reluctant to aggressively rate control her presently given her previously documented ventricular stand still.  I think that ablation is the most prudent step at this time.  She has been in atrial flutter for < 25 hours and has been initiated on eliquis.  I do not anticipate that she would require TEE if her ablation is performed tomorrow.  I had hoped to perform the procedure today but unfortunately she has already eaten. Therapeutic strategies for atrial flutter including medicine and ablation were discussed in detail with the patient and her spouse (POA) today. Risk, benefits, and alternatives to EP study and radiofrequency ablation were also discussed in detail today. These risks include but are not limited to stroke, bleeding, vascular damage, tamponade, perforation, damage to the heart and other structures, AV block requiring pacemaker, worsening renal function, and death. They understand these risk and wishes to proceed.  We will therefore proceed with catheter ablation at the next available time.  I have placed on Dr Forde Dandy schedule for tomorrow am.     Thompson Grayer, MD 08/27/2013 4:08 PM

## 2013-08-28 NOTE — Interval H&P Note (Signed)
History and Physical Interval Note: Patient seen and examined. Agree with above. She is ready for EPS/RFA of atrial flutter. I have reviewed indications, risks/benefits/expectations of the procedure with the patient and her family and she wishes to proceed.  08/28/2013 11:28 AM  Amber Chan  has presented today for surgery, with the diagnosis of aflutter  The various methods of treatment have been discussed with the patient and family. After consideration of risks, benefits and other options for treatment, the patient has consented to  Procedure(s): ATRIAL FLUTTER ABLATION (N/A) as a surgical intervention .  The patient's history has been reviewed, patient examined, no change in status, stable for surgery.  I have reviewed the patient's chart and labs.  Questions were answered to the patient's satisfaction.     Evans Lance, M.D.

## 2013-08-28 NOTE — Discharge Instructions (Signed)
° °  [facility] No driving for 3 days. No lifting over 5 lbs for 1 week. No sexual activity for 1 week. Keep procedure site clean & dry. If you notice increased pain, swelling, bleeding or pus, call/return!  You may shower, but no soaking baths/hot tubs/pools for 1 week.  _____________________________________________________________________________________

## 2013-08-28 NOTE — CV Procedure (Signed)
Electrophysiology Procedure Note  Procedure: Electrophysiologic study, and catheter ablation of atrial flutter  Indication: Symptomatic atrial flutter  Description of procedure: After informed consent was obtained, the patient was taken to the diagnostic electrophysiology laboratory in a fasting state. After the usual preparation draping, a 6 [place] octapolar catheter was inserted percutaneously into the right femoral vein and advanced under fluoroscopic guidance to the coronary sinus. Next a 6 French quadripolar catheter was inserted percutaneously into the right femoral vein and advanced to the His bundle region. Mapping was carried out which demonstrated typical counterclockwise tricuspid annular reentrant atrial flutter, at a cycle of 210 ms. A 7 French quadripolar ablation catheter was inserted percutaneously into the right femoral vein, and advanced under fluoroscopic guidance into the region of the tricuspid valve annulus. Additional mapping was carried out. A single radiofrequency energy application was delivered, resulting in termination of atrial flutter, and restoration of sinus rhythm. 10 additional radiofrequency energy applications were delivered, as the atrial flutter is presence conduction would terminate but then returned. Following the final radiofrequency energy application, atrial flutter isthmus block was demonstrated and permanent. Rapid atrial pacing was carried out demonstrating atrial flutter isthmus block, and the AV Wenckebach cycle length was 380 ms. Programmed atrial stimulation was then carried out at a basic cycle of 500 ms. The S1-S2 interval was stepwise decrease down to 340 ms were the AV node ERP was demonstrated. Next rapid ventricular pacing was carried out from the ablation catheter and stepwise decrease to 460 ms were be a block was demonstrated. Finally programmed ventricular stimulation was carried out from the right ventricle at a cycle length of 500 ms. The S1-S2  interval was stepwise decreased down to 420 ms. Ventricular ERP was demonstrated. The catheters were then removed, hemostasis was assured, and the patient was returned to her room in satisfactory condition.  Complications: There were no immediate procedure complications  Results. 1. Baseline ECG. The baseline ECG demonstrated atrial flutter with variable AV conduction. 2. Baseline intervals. The atrial flutter cycle length was 210 ms. The HV interval was 50 ms. In sinus rhythm, the AH interval was 84 ms. The QRS duration was 80 ms. The sinus node cycle was 1122 ms. 3. Rapid ventricular pacing. Rapid ventricular pacing was carried out from the right ventricle demonstrating a VA block cycle length of 460 ms. During rapid ventricular pacing, the atrial activation appeared to be midline and decremental. 4. Programmed ventricular stimulation. Program ventricular stimulation was carried out on the right ventricle at a basic cycle of 500 ms. The S1-S2 interval stepwise decreased down to 420 ms with the ventricular ERP was noted. During programmed ventricular stimulation, the atrial activation appeared to be midline and decremental. 5. Rapid atrial pacing. Following catheter ablation, rapid atrial pacing was carried out from the atrium at a pacing cycle length of 600 ms and stepwise decreased down to 210 ms. AV block was demonstrated at 380 ms. There was no inducible atrial flutter or other atrial arrhythmia. 6. Programmed atrial stimulation. Programmed atrial stimulation was carried out on the atrium at a pacing cycle length of 500 ms. The S1 and S2 interval stepwise decreased down to 340 ms. During programmed atrial stimulation, there was no AH jump, and no inducible SVT. 7. Arrhythmias observed. Atrial flutter, cycle length 210 ms, with variable AV conduction, was observed at the initiation of the procedure, and terminated with catheter ablation. 8. Mapping. Mapping of atrial flutter demonstrated typical  counterclockwise tricuspid annular reentry. 9. Radiofrequency energy application. A  total of 11 radiofrequency energy applications were delivered. During the first radiofrequency energy application, atrial flutter was terminated, and sinus rhythm restored. 10 additional radiofrequency energy applications were delivered, creating isthmus block  Conclusion: This study demonstrates successful electrophysiology study and catheter ablation of typical atrial flutter with 11 radiofrequency energy applications delivered to the usual atrial flutter isthmus.  Cristopher Peru, M.D.

## 2013-08-28 NOTE — Progress Notes (Signed)
EP Attending  Called to see patient for pericardial effusion on echo. She had this ordered yesterday but it was not done before her ablation. The patient denies chest pain or sob and HR and blood pressure have been normal. No rub on exam. No pulsus paradoxus.  Review of her cxr demonstrates cardiac enlargement prior to her echo on presentation which along with her exam, lack of symptoms and fairly large effusion by echo suggest a chronic process, rather than an acute process caused by her ablation today. There was no mention of tamponade physiology on echo.  I wish she would have had her echo pre-ablation. I will plan to hold her anti-coagulation tonight. If she has a chronic effusion, I would not want eliquis to result in a hemorrhagic transformation. Will check a CXR tomorrow and compare with the study on admit. I have discussed all of these issues with the patient and her husband and her nurse.  Mikle Bosworth.D.

## 2013-08-28 NOTE — Progress Notes (Signed)
Patient seen examiend earlier today NO specific issues HR in 100's No n/v/cp.  NPO for procedure.  D/w Dr. Sandrea Hammond issues primarily CV, will sign off as attneding to Dr. Lovena Le but am available if needed Appreciate cardiology input  Verneita Griffes, MD [facility] (P) [phone]

## 2013-08-28 NOTE — Progress Notes (Signed)
  Echocardiogram 2D Echocardiogram has been performed.  Dunbar 08/28/2013, 5:44 PM

## 2013-08-29 DIAGNOSIS — I4892 Unspecified atrial flutter: Secondary | ICD-10-CM | POA: Diagnosis not present

## 2013-08-29 DIAGNOSIS — R55 Syncope and collapse: Secondary | ICD-10-CM | POA: Diagnosis not present

## 2013-08-29 DIAGNOSIS — I469 Cardiac arrest, cause unspecified: Secondary | ICD-10-CM | POA: Diagnosis not present

## 2013-08-29 DIAGNOSIS — F0280 Dementia in other diseases classified elsewhere without behavioral disturbance: Secondary | ICD-10-CM | POA: Diagnosis not present

## 2013-08-29 DIAGNOSIS — C50919 Malignant neoplasm of unspecified site of unspecified female breast: Secondary | ICD-10-CM | POA: Diagnosis not present

## 2013-08-29 MED ORDER — OFF THE BEAT BOOK
Freq: Once | Status: AC
  Administered 2013-08-29
  Filled 2013-08-29: qty 1

## 2013-08-29 MED ORDER — APIXABAN 2.5 MG PO TABS: 2.5000 mg | ORAL_TABLET | Freq: Two times a day (BID) | ORAL | Status: DC

## 2013-08-29 NOTE — Discharge Summary (Signed)
ELECTROPHYSIOLOGY DISCHARGE SUMMARY   Patient ID: Amber Chan,  MRN: [id number], DOB/AGE: 06/12/1934 79 y.o.  Admit date: 08/25/2013 Discharge date: 08/29/2013  Primary Care Physician: Unk Pinto, MD Primary EP: Lovena Le, MD  Primary Discharge Diagnoses:  1. Atrial flutter with RVR s/p EPS +RF ablation of typical atrial flutter 2. Syncope secondary to atrial flutter and long pauses  3. Pericardial effusion - Per Dr. Tanna Furry note - review of her CT scan which she had on admit and before her ablation demonstrated a moderate pericardial effusion. She is asymptomatic. Will recheck in 3-4 weeks. If not resolving, would consider elective pericardiocentesis vs pericardial window. Will hold off on this for now as patient would ideally be on anti-coagulation for 3 weeks after her ablation of atrial flutter. If effusion became symptomatic, would stop Eliquis and tap.  Secondary Discharge Diagnoses:  1. Dementia 2. Breast CA s/p mastectomy 3. Hypothyroidism  Procedures This Admission:    1. CT chest angiogram 08/25/2013 FINDINGS: Bones: Mild thoracic spondylosis and scattered Schmorl's nodes. No aggressive osseous lesions. Lungs: Bilateral pleural apical scarring. Emphysema. Old granulomatous disease is present with large calcified granuloma in the superior aspect of the right middle lobe. Dependent atelectasis is present in the left lower lobe. No airspace disease. Central airways: Patent. Vasculature: Technically adequate study. No pulmonary embolism. Aortic atherosclerosis. Effusions: Moderate pericardial effusion. Effusion measures 2 cm in thickness along the inferior surface of the heart. Lymphadenopathy: No axillary adenopathy. No mediastinal or hilar adenopathy. Calcified lymph nodes associated with old granulomatous disease. Esophagus: Normal. Upper abdomen: Old granulomatous disease of the spleen. Other: Calcified nodule in the right thyroid lobe. Review of the MIP  images confirms the above findings. IMPRESSION: 1. Negative for pulmonary embolism or acute aortic abnormality. 2. Emphysema and old granulomatous disease. 3. Moderate pericardial effusion.  2. Comprehensive EP study +RF ablation of typical appearing atrial flutter 08/28/2013 Results:  1. Baseline ECG. The baseline ECG demonstrated atrial flutter with variable AV conduction.  2. Baseline intervals. The atrial flutter cycle length was 210 ms. The HV interval was 50 ms. In sinus rhythm, the AH interval was 84 ms. The QRS duration was 80 ms. The sinus node cycle was 1122 ms.  3. Rapid ventricular pacing. Rapid ventricular pacing was carried out from the right ventricle demonstrating a VA block cycle length of 460 ms. During rapid ventricular pacing, the atrial activation appeared to be midline and decremental.  4. Programmed ventricular stimulation. Program ventricular stimulation was carried out on the right ventricle at a basic cycle of 500 ms. The S1-S2 interval stepwise decreased down to 420 ms with the ventricular ERP was noted. During programmed ventricular stimulation, the atrial activation appeared to be midline and decremental.  5. Rapid atrial pacing. Following catheter ablation, rapid atrial pacing was carried out from the atrium at a pacing cycle length of 600 ms and stepwise decreased down to 210 ms. AV block was demonstrated at 380 ms. There was no inducible atrial flutter or other atrial arrhythmia.  6. Programmed atrial stimulation. Programmed atrial stimulation was carried out on the atrium at a pacing cycle length of 500 ms. The S1 and S2 interval stepwise decreased down to 340 ms. During programmed atrial stimulation, there was no AH jump, and no inducible SVT.  7. Arrhythmias observed. Atrial flutter, cycle length 210 ms, with variable AV conduction, was observed at the initiation of the procedure, and terminated with catheter ablation.  8. Mapping. Mapping of atrial flutter  demonstrated typical counterclockwise tricuspid  annular reentry.  9. Radiofrequency energy application. A total of 11 radiofrequency energy applications were delivered. During the first radiofrequency energy application, atrial flutter was terminated, and sinus rhythm restored. 10 additional radiofrequency energy applications were delivered, creating isthmus block  Complications: There were no immediate procedure complications Conclusion: This study demonstrates successful electrophysiology study and catheter ablation of typical atrial flutter with 11 radiofrequency energy applications delivered to the usual atrial flutter isthmus.  3. 2D echocardiogram 08/28/2013 Study Conclusions - Left ventricle: The cavity size was normal. Systolic function was normal. The estimated ejection fraction was in the range of 55% to 60%. Wall motion was normal; there were no regional wall motion abnormalities. - Pericardium, extracardiac: There is a pericardial effusion measuring 2.39cm in the inferoseptal region in short axis view, 0.9cm anterior. There is possible layered echogenic material along the right ventricular free wall/ inferior free wall of left ventricleseen on short axis. There is no evidence of right ventricular or right atrial diastolic collapse. IVC measures 5mm and demonstrates 50% respiratory change. Heart rate 72bpm. No obvious signs of increased intrapericardial pressure. Unknown chronicity of pericardial effusion.  History and Hospital Course:  Amber Chan is a 79 year old woman with dementia who was admitted with syncope on 08/25/2013. Her husband reports that they had travelled home from [place] yesterday and were both tired. They went into the house and he was in another room when he heard a "thump." He went into her room and found her unresponsive. Amber Chan does not recall any symptoms prior to LOC. She was confused after regaining consciousness. She apparently had another episode  witnessed by EMS. She was evaluated by Dr. Rayann Heman. We were able to obtain records from [facility] which revealed that she did have atrial flutter with ventricular stand still and pauses >6 seconds. In sinus rhythm, her AV conduction appears to be intact. Given these findings, Dr. Rayann Heman recommended to proceed with atrial flutter ablation. In addition, during EP study, we could also evaluate her his-purkinje system with intracardiac measurements to see if pacemaker would be needed. He anticipated that if we ablate atrial flutter, we could avoid PPM long term. Ms. Lye and her husband agreed. On 08/28/2013 she underwent EPS +RF ablation of typical appearing atrial flutter. Please see full details as outlined above. She tolerated this procedure well without any immediate complication. She remains hemodynamically stable and afebrile. Her groin site is intact without significant bleeding or hematoma. Of note, she had an echo done post ablation (was previously ordered on admission but not done until after EP study) that showed a moderate pericardial effusion without evidence of tamponade. It is important to note that her chest CT on admission also showed a pericardial effusion indicating this was present on admission and not a result of complication from EPS and catheter ablation. This will be followed closely while on Eliquis. Please see Dr. Tanna Furry comments listed above. She will start low dose Eliquis and continue this for 3 weeks post ablation. She has been given discharge instructions including wound care and activity restrictions. She will follow-up in clinic for repeat echocardiogram in 3-4 weeks then will see Dr. Lovena Le on 09/27/2013. She has been seen, examined and deemed stable for discharge today by Dr. Cristopher Peru.  Discharge Vitals: Blood pressure 134/68, pulse 69, temperature 98.4 F (36.9 C), temperature source Oral, resp. rate 18, height 5\' 7"  (1.702 m), weight 120 lb 13 oz (54.8 kg),  SpO2 99.00%.   Labs: Lab Results  Component Value  Date   WBC 6.8 08/26/2013   HGB 12.9 08/26/2013   HCT 38.6 08/26/2013   MCV 98.2 08/26/2013   PLT 161 08/26/2013     Recent Labs Lab 08/28/13 0230  NA 142  K 3.6*  CL 104  CO2 26  BUN 16  CREATININE 0.71  CALCIUM 8.5  PROT 6.1  BILITOT 0.8  ALKPHOS 67  ALT 21  AST 28  GLUCOSE 99   Lab Results  Component Value Date   TROPONINI <0.30 08/26/2013    Disposition:  The patient is being discharged in stable condition.  Follow-up:     Follow-up Information   Follow up with [facility] On 09/24/2013. (At 11:30 AM for echocardiogram)    Specialty:  Cardiology   Contact information:   [address] [place] [address] [phone]      Follow up with Cristopher Peru, MD On 09/27/2013. (At 3:00 PM)    Specialty:  Cardiology   Contact information:   [address] [address] [phone]       Follow up with [facility]. (rolling walker)    Contact information:   [address] [address] [phone]      Discharge Medications:    Medication List    STOP taking these medications       BABY ASPIRIN PO      TAKE these medications       apixaban 2.5 MG Tabs tablet  Commonly known as:  ELIQUIS  Take 1 tablet (2.5 mg total) by mouth 2 (two) times daily. For 3 weeks then stop.     calcium carbonate 500 MG chewable tablet  Commonly known as:  TUMS - dosed in mg elemental calcium  Chew 2 tablets by mouth 2 (two) times daily.     donepezil 10 MG tablet  Commonly known as:  ARICEPT  Take 10 mg by mouth at bedtime.     levothyroxine 50 MCG tablet  Commonly known as:  SYNTHROID, LEVOTHROID  Take 50 mcg by mouth daily before breakfast.     VITAMIN D PO  Take 2,000 Units by mouth 2 (two) times daily.       Duration of Discharge Encounter: Greater than 30 minutes including physician time.  SignedAndrez Grime, PA-C 08/29/2013, 2:01 PM  EP Attending  Patient seen and examined. Agree with above history, exam, assessment and plan. [place] for discharge.  Mikle Bosworth.D.

## 2013-08-29 NOTE — Progress Notes (Signed)
Patient ID: ADALAYA IRION, female   DOB: 06/04/1934, 79 y.o.   MRN: [id number]   Patient Name: Amber Chan Date of Encounter: 08/29/2013     Principal Problem:   Syncope Active Problems:   Breast cancer   Hyperlipidemia   SDAT (senile dementia of Alzheimer's type)   Pulseless electrical activity    SUBJECTIVE No chest pain or sob and she feels well.  CURRENT MEDS . calcium carbonate  2 tablet Oral BID  . levothyroxine  50 mcg Oral QAC breakfast  . sodium chloride  3 mL Intravenous Q12H  . sodium chloride  3 mL Intravenous Q12H  . sodium chloride  3 mL Intravenous Q12H    OBJECTIVE  Filed Vitals:   08/29/13 0027 08/29/13 0500 08/29/13 0507 08/29/13 0741  BP: 131/60  112/65 134/68  Pulse: 65  72 69  Temp: 98.4 F (36.9 C)  98.2 F (36.8 C) 98.4 F (36.9 C)  TempSrc: Oral  Oral Oral  Resp: 18  20 18   Height:      Weight: 120 lb 13 oz (54.8 kg) 120 lb 13 oz (54.8 kg)    SpO2: 98%  98% 99%    Intake/Output Summary (Last 24 hours) at 08/29/13 0825 Last data filed at 08/28/13 1900  Gross per 24 hour  Intake    240 ml  Output    400 ml  Net   -160 ml   Filed Weights   08/28/13 0443 08/29/13 0027 08/29/13 0500  Weight: 120 lb 2.4 oz (54.5 kg) 120 lb 13 oz (54.8 kg) 120 lb 13 oz (54.8 kg)    PHYSICAL EXAM  General: Pleasant, NAD. Neuro: Alert and oriented X 3. Moves all extremities spontaneously. Psych: Normal affect. HEENT:  Normal  Neck: Supple without bruits or JVD. Lungs:  Resp regular and unlabored, CTA. Heart: RRR no s3, s4, or murmurs, no rub Abdomen: Soft, non-tender, non-distended, BS + x 4.  Extremities: No clubbing, cyanosis or edema. DP/PT/Radials 2+ and equal bilaterally.  Accessory Clinical Findings  CBC No results found for this basename: WBC, NEUTROABS, HGB, HCT, MCV, PLT,  in the last 72 hours Basic Metabolic Panel  Recent Labs  08/28/13 0230  NA 142  K 3.6*  CL 104  CO2 26  GLUCOSE 99  BUN 16  CREATININE 0.71   CALCIUM 8.5  MG 1.7   Liver Function Tests  Recent Labs  08/28/13 0230  AST 28  ALT 21  ALKPHOS 67  BILITOT 0.8  PROT 6.1  ALBUMIN 2.9*   No results found for this basename: LIPASE, AMYLASE,  in the last 72 hours Cardiac Enzymes  Recent Labs  08/26/13 0839 08/26/13 1356  TROPONINI <0.30 <0.30   BNP No components found with this basename: POCBNP,  D-Dimer No results found for this basename: DDIMER,  in the last 72 hours Hemoglobin A1C No results found for this basename: HGBA1C,  in the last 72 hours Fasting Lipid Panel No results found for this basename: CHOL, HDL, LDLCALC, TRIG, CHOLHDL, LDLDIRECT,  in the last 72 hours Thyroid Function Tests No results found for this basename: TSH, T4TOTAL, FREET3, T3FREE, THYROIDAB,  in the last 72 hours  TELE nsr  ECG  nsr  Radiology/Studies  Dg Chest 2 View  08/28/2013   CLINICAL DATA:  Post ablation. Compared to cardiac silhouette from prior study 08/25/2013.  EXAM: CHEST  2 VIEW  COMPARISON:  CT ANGIO CHEST W/CM &/OR WO/CM dated 08/25/2013; DG CHEST 1V PORT dated 08/25/2013  FINDINGS: Cardiac enlargement. Heart size and cardiothoracic ratio are stable since prior study. No pulmonary vascular congestion or edema. No focal airspace disease or consolidation in the lungs. Hyperinflation likely representing emphysema. Calcified granuloma in the right upper lung. Mild calcification of the aorta. Degenerative changes in the spine.  IMPRESSION: Heart size and cardiac configuration are stable since previous study. Persistent mild cardiac enlargement. Emphysematous changes in the lungs.   Electronically Signed   By: Lucienne Capers M.D.   On: 08/28/2013 22:31   Ct Angio Chest Pe W/cm &/or Wo Cm  08/25/2013   CLINICAL DATA:  Syncope.  Loss of consciousness.  Seizure.  EXAM: CT ANGIOGRAPHY CHEST WITH CONTRAST  TECHNIQUE: Multidetector CT imaging of the chest was performed using the standard protocol during bolus administration of intravenous  contrast. Multiplanar CT image reconstructions and MIPs were obtained to evaluate the vascular anatomy.  CONTRAST:  70mL OMNIPAQUE IOHEXOL 350 MG/ML SOLN  COMPARISON:  DG CHEST 1V PORT dated 08/25/2013  FINDINGS: Bones: Mild thoracic spondylosis and scattered Schmorl's nodes. No aggressive osseous lesions.  : Lungs: Bilateral pleural apical scarring. Emphysema. Old granulomatous disease is present with large calcified granuloma in the superior aspect of the right middle lobe. Dependent atelectasis is present in the left lower lobe. No airspace disease.  Central airways: Patent.  Vasculature: Technically adequate study. No pulmonary embolism. Aortic atherosclerosis.  Effusions: Moderate pericardial effusion. Effusion measures 2 cm in thickness along the inferior surface of the heart.  Lymphadenopathy: No axillary adenopathy. No mediastinal or hilar adenopathy. Calcified lymph nodes associated with old granulomatous disease.  Esophagus: Normal.  Upper abdomen: Old granulomatous disease of the spleen.  Other: Calcified nodule in the right thyroid lobe.  Review of the MIP images confirms the above findings.  IMPRESSION: 1. Negative for pulmonary embolism or acute aortic abnormality. 2. Emphysema and old granulomatous disease. 3. Moderate pericardial effusion.   Electronically Signed   By: Dereck Ligas M.D.   On: 08/25/2013 23:58   Dg Chest Port 1 View  08/25/2013   CLINICAL DATA:  Chest pain. Current history of treated hypothyroidism, labile hypertension. Prior history of breast cancer post mastectomy.  EXAM: PORTABLE CHEST - 1 VIEW  COMPARISON:  CHEST-2V dated 05/26/2007; DG CHEST 2 VIEW dated 04/14/2005; DG CHEST 2 VIEW dated 04/02/2004; DG CHEST 2 VIEW dated 04/17/2002  FINDINGS: Cardiac silhouette moderately to markedly enlarged but stable. Calcified granuloma in the right upper lobe, unchanged. Emphysematous changes throughout both lungs with hyperinflation, unchanged. Lungs otherwise clear. No localized airspace  consolidation. No pleural effusions. No pneumothorax. Normal pulmonary vascularity.  IMPRESSION: COPD/emphysema. No acute cardiopulmonary disease. Stable moderate to marked cardiomegaly.   Electronically Signed   By: Evangeline Dakin M.D.   On: 08/25/2013 21:12    ASSESSMENT AND PLAN 1.atrial flutter with a RVR 2. Syncope secondary to atrial flutter and long pauses 3. S/p catheter ablation of atrial flutter 4. Pericardial effusion - review of her CT scan which she had on admit and before her ablation demonstrated a moderate pericardial effusion. She is asymptomatic. Will recheck in 3-4 weeks. If not resolving, would consider elective pericardiocentesis vs pericardial window. Will hold off on this for now as patient would ideally be on anti-coagulation for 3 weeks after her ablation of atrial flutter. If effusion became symptomatic, would stop eliquis and tap. 5. Disposition - ok for discharge home on 2.5 mg twice daily of eliquis, with repeat echo in 3 weeks, stopping of eliquis in 3 weeks and followup with me  after echo is done. I have carefully warned the patient and her husband about symptoms of chest pain or sob or syncope which might indicate that her effusion is enlarging. I am concerned about continuing eliquis but she is s/p ablation of atrial flutter and would like to reduce her risk of stroke. Will use low dose eliquis.   Gregg Taylor,M.D.  08/29/2013 8:25 AM

## 2013-08-29 NOTE — Care Management Note (Signed)
    Page 1 of 1   08/29/2013     11:09:37 AM CARE MANAGEMENT NOTE 08/29/2013  Patient:  Amber Chan, Amber Chan   Account Number:  [id number]  Date Initiated:  08/27/2013  Documentation initiated by:  [facility]  Subjective/Objective Assessment:   syncope, aflutter     Action/Plan:   home with husband   Anticipated DC Date:     Anticipated DC Plan:  [place]  CM consult      Choice offered to / List presented to:  C-1 Patient   DME arranged  Vassie Moselle      DME agency  [place] arranged  [place] - 11 Patient Refused      Status of service:  Completed, signed off Medicare Important Message given?  YES (If response is "NO", the following Medicare IM given date fields will be blank) Date Medicare IM given:  08/25/2013 Date Additional Medicare IM given:    Discharge Disposition:    Per UR Regulation:  Reviewed for med. necessity/level of care/duration of stay  If discussed at [place] of Stay Meetings, dates discussed:   08/30/2013    Comments:  08/29/13 Mona, RN, [place], NCM [phone]. Pt qualifies for DME rolling walker.DME rolling walker ordered through [place], per pt choice.Jem[facility] of [facility] notified to deliver rolling walker to pt room prior to D/C home.  I have recommended that this patient have [place] but she declines at this time. I have discussed the benefits of Home Health with her. The patient  and husband verbalizes understanding.

## 2013-09-03 ENCOUNTER — Encounter (HOSPITAL_COMMUNITY): Payer: Self-pay | Admitting: *Deleted

## 2013-09-21 ENCOUNTER — Telehealth: Payer: Self-pay | Admitting: Internal Medicine

## 2013-09-21 MED ORDER — ASPIRIN EC 81 MG PO TBEC: 81.0000 mg | DELAYED_RELEASE_TABLET | Freq: Every day | ORAL | Status: DC

## 2013-09-21 NOTE — Telephone Encounter (Signed)
Per discharge summary says to stop after 3 weeks.  Okay to restart the ASA 81 mg

## 2013-09-21 NOTE — Telephone Encounter (Signed)
New message    Should discontinue eliquis be restarted  & low dosage ASA - due to recent procedure.

## 2013-09-24 ENCOUNTER — Ambulatory Visit (HOSPITAL_COMMUNITY): Payer: Medicare Other | Attending: Cardiology | Admitting: Radiology

## 2013-09-24 ENCOUNTER — Other Ambulatory Visit (HOSPITAL_COMMUNITY): Payer: Self-pay | Admitting: Radiology

## 2013-09-24 DIAGNOSIS — I4892 Unspecified atrial flutter: Secondary | ICD-10-CM | POA: Diagnosis not present

## 2013-09-24 DIAGNOSIS — I319 Disease of pericardium, unspecified: Secondary | ICD-10-CM | POA: Diagnosis not present

## 2013-09-24 NOTE — Progress Notes (Signed)
Echocardiogram performed.  

## 2013-09-27 ENCOUNTER — Ambulatory Visit (INDEPENDENT_AMBULATORY_CARE_PROVIDER_SITE_OTHER): Payer: Medicare Other | Admitting: Internal Medicine

## 2013-09-27 ENCOUNTER — Encounter: Payer: Self-pay | Admitting: Internal Medicine

## 2013-09-27 VITALS — BP 133/72 | HR 68 | Ht 68.0 in | Wt 121.0 lb

## 2013-09-27 DIAGNOSIS — I4892 Unspecified atrial flutter: Secondary | ICD-10-CM | POA: Diagnosis not present

## 2013-09-27 DIAGNOSIS — I3139 Other pericardial effusion (noninflammatory): Secondary | ICD-10-CM | POA: Insufficient documentation

## 2013-09-27 DIAGNOSIS — I319 Disease of pericardium, unspecified: Secondary | ICD-10-CM | POA: Diagnosis not present

## 2013-09-27 DIAGNOSIS — I313 Pericardial effusion (noninflammatory): Secondary | ICD-10-CM | POA: Insufficient documentation

## 2013-09-27 NOTE — Progress Notes (Signed)
      HPI Amber Chan return today for followup. She is a pleasant 78 yo woman with a h/o atrial flutter and pericardial effusion who presented with atrial flutter, and underwent catheter ablation several weeks ago. In the interim she has done well. She denies chest pain or sob. No syncope. She had a repeat 2D echo a month after her ablation and her effusion appears to be smaller. No palpitations or peripheral edema. No Known Allergies   Current Outpatient Prescriptions  Medication Sig Dispense Refill  . aspirin EC 81 MG tablet Take 1 tablet (81 mg total) by mouth daily.  90 tablet  3  . calcium carbonate (TUMS - DOSED IN MG ELEMENTAL CALCIUM) 500 MG chewable tablet Chew 2 tablets by mouth 2 (two) times daily.      . Cholecalciferol (VITAMIN D PO) Take 2,000 Units by mouth 2 (two) times daily.       Marland Kitchen donepezil (ARICEPT) 10 MG tablet Take 10 mg by mouth at bedtime.      Marland Kitchen levothyroxine (SYNTHROID, LEVOTHROID) 50 MCG tablet Take 50 mcg by mouth daily before breakfast.       No current facility-administered medications for this visit.     Past Medical History  Diagnosis Date  . Labile hypertension   . Hypothyroid   . IBS (irritable bowel syndrome)   . Vitamin D deficiency   . Breast cancer 1970    Right  . Dementia     moderate  . Atrial flutter     ROS:   All systems reviewed and negative except as noted in the HPI.   Past Surgical History  Procedure Laterality Date  . Mastectomy      right 1978 left 1980  . Cholecystectomy      1988  . Biopsy thyroid      1999  . Ablation  08-28-2013    RFCA ablation of atrial flutter by Dr Lovena Le     Family History  Problem Relation Age of Onset  . Hypertension Mother   . Cancer Mother     colon  . Stroke Father   . Heart disease Father   . Diabetes Brother      History   Social History  . Marital Status: Married    Spouse Name: N/A    Number of Children: N/A  . Years of Education: N/A   Occupational History    . Not on file.   Social History Main Topics  . Smoking status: Never Smoker   . Smokeless tobacco: Not on file  . Alcohol Use: No  . Drug Use: No  . Sexual Activity: Not on file   Other Topics Concern  . Not on file   Social History Narrative   Lives in [place]     BP 133/72  Pulse 68  Ht 5\' 8"  (1.727 m)  Wt 121 lb (54.885 kg)  BMI 18.40 kg/m2  Physical Exam:  Well appearing 79 yo woman, NAD HEENT: Unremarkable Neck:  No JVD, no thyromegally Back:  No CVA tenderness Lungs:  Clear with no wheezes HEART:  Regular rate rhythm, no murmurs, no rubs, no clicks Abd:  soft, positive bowel sounds, no organomegally, no rebound, no guarding Ext:  2 plus pulses, no edema, no cyanosis, no clubbing Skin:  No rashes no nodules Neuro:  CN II through XII intact, motor grossly intact  EKG - nsr with PVC's  Assess/Plan:

## 2013-09-27 NOTE — Patient Instructions (Signed)
Your physician recommends that you schedule a follow-up appointment as needed  

## 2013-09-27 NOTE — Assessment & Plan Note (Signed)
I have reviewed her repeat echo. I have asked the patient to stop taking ASA. Will not plan to repeat her echo as needed.

## 2013-09-27 NOTE — Assessment & Plan Note (Signed)
She is s/p ablation and maintaining NSR very nicely. No change in meds.

## 2013-11-12 ENCOUNTER — Other Ambulatory Visit: Payer: Self-pay | Admitting: Internal Medicine

## 2014-01-17 DIAGNOSIS — H2513 Age-related nuclear cataract, bilateral: Secondary | ICD-10-CM | POA: Diagnosis not present

## 2014-02-12 ENCOUNTER — Ambulatory Visit: Payer: Self-pay | Admitting: Physician Assistant

## 2014-02-18 ENCOUNTER — Ambulatory Visit (INDEPENDENT_AMBULATORY_CARE_PROVIDER_SITE_OTHER): Payer: Medicare Other | Admitting: Physician Assistant

## 2014-02-18 ENCOUNTER — Encounter: Payer: Self-pay | Admitting: Physician Assistant

## 2014-02-18 VITALS — BP 128/70 | HR 60 | Temp 97.7°F | Resp 16 | Wt 118.0 lb

## 2014-02-18 DIAGNOSIS — E782 Mixed hyperlipidemia: Secondary | ICD-10-CM

## 2014-02-18 DIAGNOSIS — C50919 Malignant neoplasm of unspecified site of unspecified female breast: Secondary | ICD-10-CM

## 2014-02-18 DIAGNOSIS — R6889 Other general symptoms and signs: Secondary | ICD-10-CM | POA: Diagnosis not present

## 2014-02-18 DIAGNOSIS — I4892 Unspecified atrial flutter: Secondary | ICD-10-CM | POA: Diagnosis not present

## 2014-02-18 DIAGNOSIS — Z79899 Other long term (current) drug therapy: Secondary | ICD-10-CM

## 2014-02-18 DIAGNOSIS — I10 Essential (primary) hypertension: Secondary | ICD-10-CM

## 2014-02-18 DIAGNOSIS — Z9181 History of falling: Secondary | ICD-10-CM

## 2014-02-18 DIAGNOSIS — Z0001 Encounter for general adult medical examination with abnormal findings: Secondary | ICD-10-CM | POA: Diagnosis not present

## 2014-02-18 DIAGNOSIS — Z23 Encounter for immunization: Secondary | ICD-10-CM | POA: Diagnosis not present

## 2014-02-18 DIAGNOSIS — F0280 Dementia in other diseases classified elsewhere without behavioral disturbance: Secondary | ICD-10-CM

## 2014-02-18 DIAGNOSIS — R0989 Other specified symptoms and signs involving the circulatory and respiratory systems: Secondary | ICD-10-CM

## 2014-02-18 DIAGNOSIS — R7303 Prediabetes: Secondary | ICD-10-CM

## 2014-02-18 DIAGNOSIS — E039 Hypothyroidism, unspecified: Secondary | ICD-10-CM

## 2014-02-18 DIAGNOSIS — K589 Irritable bowel syndrome without diarrhea: Secondary | ICD-10-CM

## 2014-02-18 DIAGNOSIS — Z1331 Encounter for screening for depression: Secondary | ICD-10-CM

## 2014-02-18 DIAGNOSIS — E559 Vitamin D deficiency, unspecified: Secondary | ICD-10-CM

## 2014-02-18 DIAGNOSIS — G301 Alzheimer's disease with late onset: Secondary | ICD-10-CM

## 2014-02-18 LAB — BASIC METABOLIC PANEL WITH GFR
BUN: 18 mg/dL (ref 6–23)
CHLORIDE: 101 meq/L (ref 96–112)
CO2: 29 mEq/L (ref 19–32)
Calcium: 8.8 mg/dL (ref 8.4–10.5)
Creat: 0.98 mg/dL (ref 0.50–1.10)
GFR, EST AFRICAN AMERICAN: 64 mL/min
GFR, Est Non African American: 56 mL/min — ABNORMAL LOW
GLUCOSE: 87 mg/dL (ref 70–99)
Potassium: 3.9 mEq/L (ref 3.5–5.3)
Sodium: 138 mEq/L (ref 135–145)

## 2014-02-18 LAB — CBC WITH DIFFERENTIAL/PLATELET
BASOS PCT: 0 % (ref 0–1)
Basophils Absolute: 0 10*3/uL (ref 0.0–0.1)
Eosinophils Absolute: 0.1 10*3/uL (ref 0.0–0.7)
Eosinophils Relative: 1 % (ref 0–5)
HEMATOCRIT: 38 % (ref 36.0–46.0)
Hemoglobin: 13.2 g/dL (ref 12.0–15.0)
Lymphocytes Relative: 19 % (ref 12–46)
Lymphs Abs: 1 10*3/uL (ref 0.7–4.0)
MCH: 33.5 pg (ref 26.0–34.0)
MCHC: 34.7 g/dL (ref 30.0–36.0)
MCV: 96.4 fL (ref 78.0–100.0)
Monocytes Absolute: 0.5 10*3/uL (ref 0.1–1.0)
Monocytes Relative: 9 % (ref 3–12)
NEUTROS ABS: 3.6 10*3/uL (ref 1.7–7.7)
Neutrophils Relative %: 71 % (ref 43–77)
Platelets: 231 10*3/uL (ref 150–400)
RBC: 3.94 MIL/uL (ref 3.87–5.11)
RDW: 14.4 % (ref 11.5–15.5)
WBC: 5 10*3/uL (ref 4.0–10.5)

## 2014-02-18 LAB — LIPID PANEL
Cholesterol: 169 mg/dL (ref 0–200)
HDL: 62 mg/dL (ref >39)
LDL CALC: 89 mg/dL (ref 0–99)
Total CHOL/HDL Ratio: 2.7 Ratio
Triglycerides: 88 mg/dL (ref <150)
VLDL: 18 mg/dL (ref 0–40)

## 2014-02-18 LAB — HEPATIC FUNCTION PANEL
ALK PHOS: 69 U/L (ref 39–117)
ALT: 10 U/L (ref 0–35)
AST: 19 U/L (ref 0–37)
Albumin: 4.1 g/dL (ref 3.5–5.2)
BILIRUBIN INDIRECT: 0.4 mg/dL (ref 0.2–1.2)
Bilirubin, Direct: 0.1 mg/dL (ref 0.0–0.3)
TOTAL PROTEIN: 6.3 g/dL (ref 6.0–8.3)
Total Bilirubin: 0.5 mg/dL (ref 0.2–1.2)

## 2014-02-18 LAB — TSH: TSH: 3.076 u[IU]/mL (ref 0.350–4.500)

## 2014-02-18 LAB — MAGNESIUM: MAGNESIUM: 1.8 mg/dL (ref 1.5–2.5)

## 2014-02-18 MED ORDER — MEMANTINE HCL ER 28 MG PO CP24: ORAL_CAPSULE | ORAL | Status: DC

## 2014-02-18 MED ORDER — MEMANTINE HCL 10 MG PO TABS: 10.0000 mg | ORAL_TABLET | Freq: Two times a day (BID) | ORAL | Status: DC

## 2014-02-18 NOTE — Progress Notes (Signed)
MEDICARE ANNUAL WELLNESS VISIT AND FOLLOW UP  Assessment:   1. Labile hypertension - CBC with Differential - BASIC METABOLIC PANEL WITH GFR - Hepatic function panel  2. Atrial flutter, unspecified Now in NSR s/p ablation and doing well.   3. Hypothyroidism, unspecified hypothyroidism type - TSH  4. IBS (irritable bowel syndrome) Controlled diet  5. SDAT (senile dementia of Alzheimer's type) Add Namenda XR to aricept  6. Breast cancer, unspecified laterality  7. Vitamin D deficiency Continue vitamin d  8. Prediabetes Discussed general issues about diabetes pathophysiology and management., Educational material distributed., Suggested low cholesterol diet., Encouraged aerobic exercise., Discussed foot care., Reminded to get yearly retinal exam. - [facility] DIABETES FOOT EXAM  9. Hyperlipidemia - Lipid panel  10. Medication management - Magnesium  11. Need for prophylactic vaccination against Streptococcus pneumoniae (pneumococcus) - Pneumococcal conjugate vaccine 13-valent IM  12. High fall risk Declines PT  13. Health Maintenance Due to dementia, husband declines major work ups/imaging.  14. Depression screening  negative   Plan:   During the course of the visit the patient was educated and counseled about appropriate screening and preventive services including:    Pneumococcal vaccine   Influenza vaccine  Td vaccine  Screening electrocardiogram  Screening mammography  Bone densitometry screening  Colorectal cancer screening  Diabetes screening  Glaucoma screening  Nutrition counseling   Advanced directives: given info/requested  Screening recommendations, referrals:  Vaccinations: Please see documentation below and orders this visit.   Nutrition assessed and recommended  Colonoscopy declined Mammogram declined Pap smear not indicated Pelvic exam not indicated Recommended yearly ophthalmology/optometry visit for glaucoma screening and  checkup Recommended yearly dental visit for hygiene and checkup Advanced directives - requested  Conditions/risks identified: BMI: Discussed weight loss, diet, and increase physical activity.  Increase physical activity: AHA recommends 150 minutes of physical activity a week.  Medications reviewed DEXA- declined Urinary Incontinence is an issue: discussed non pharmacology and pharmacology options.  Fall risk: high- discussed PT, home fall assessment, medications.    Subjective:   Amber Chan is a 79 y.o. female who presents for Medicare Annual Wellness Visit and 3 month follow up on hypertension, hyperlipidemia, vitamin D def.  Date of last medicare wellness visit is unknown.   Her blood pressure has been controlled at home, today their BP is BP: 128/70 mmHg She does not workout, walks. She denies chest pain, shortness of breath, dizziness.  She is not on cholesterol medication and denies myalgias. Her cholesterol is at goal. The cholesterol last visit was:   Lab Results  Component Value Date   CHOL 158 08/13/2013   HDL 63 08/13/2013   LDLCALC 78 08/13/2013   TRIG 85 08/13/2013   CHOLHDL 2.5 08/13/2013   . Last A1C in the office was:  Lab Results  Component Value Date   HGBA1C 5.6 08/13/2013   Patient is on Vitamin D supplement. Lab Results  Component Value Date   VD25OH 30 08/13/2013     She is on thyroid medication. Her medication was not changed last visit. Patient denies nervousness, palpitations and weight changes.  Lab Results  Component Value Date   TSH 2.193 08/13/2013  .  She has a history of atrial flutter, she was having syncopal events with falls, , s/p ablation in May with Dr. Lovena Le. She is no longer on bASA and the pericardial effusion has improved. Her husband denies any more falls/syncopal episodes.  She has early dementia, on aricept, her husband is here and gives  the majority of the history..   Names of Other Physician/Practitioners you  currently use: 1. [facility]- here for primary care 2. . Orvil Feil, dentist, last visit tomorrow Patient Care Team: Unk Pinto, MD as PCP - General (Internal Medicine) Inda Castle, MD as Consulting Physician (Gastroenterology) Eulis Manly. Gershon Crane, MD as Consulting Physician (Ophthalmology)- 3 weeks ago Evans Lance, MD as Consulting Physician (Cardiology)  Medication Review Current Outpatient Prescriptions on File Prior to Visit  Medication Sig Dispense Refill  . calcium carbonate (TUMS - DOSED IN MG ELEMENTAL CALCIUM) 500 MG chewable tablet Chew 2 tablets by mouth 2 (two) times daily.    . Cholecalciferol (VITAMIN D PO) Take 2,000 Units by mouth 2 (two) times daily.     Marland Kitchen donepezil (ARICEPT) 10 MG tablet TAKE 1 TABLET EVERY DAY  FOR  MEMORY 90 tablet 0  . levothyroxine (SYNTHROID, LEVOTHROID) 50 MCG tablet Take 50 mcg by mouth daily before breakfast.     No current facility-administered medications on file prior to visit.    Current Problems (verified) Patient Active Problem List   Diagnosis Date Noted  . Atrial flutter 09/27/2013  . Pericardial effusion 09/27/2013  . Syncope 08/25/2013  . Pulseless electrical activity 08/25/2013  . Vitamin D Deficiency 08/13/2013  . Other abnormal glucose 08/13/2013  . Hyperlipidemia 08/13/2013  . Encounter for long-term (current) use of other medications 08/13/2013  . SDAT (senile dementia of Alzheimer's type) 08/13/2013  . Labile hypertension   . Hypothyroid   . IBS (irritable bowel syndrome)   . Breast cancer     Screening Tests Health Maintenance  Topic Date Due  . COLONOSCOPY  11/24/1986  . ZOSTAVAX  11/23/1996  . INFLUENZA VACCINE  11/17/2013  . TETANUS/TDAP  04/20/2016  . PNEUMOCOCCAL POLYSACCHARIDE VACCINE AGE 58 AND OVER  Completed     Immunization History  Administered Date(s) Administered  . Influenza Split 02/13/2013  . Influenza-Unspecified 01/17/2013  . Pneumococcal  Polysaccharide-23 09/02/2008  . Td 04/20/2006    Preventative care: Last colonoscopy: 2003 will not get another Last mammogram: s/p double mastectomy Last pap smear/pelvic exam: 2010 DEXA:? Was on fosamax for 10 years after bilateral masectomy- declines Echo 09/2013 EF 65%, + pericardial effusion RAI therapy for thyroid 2002 CTA chest 2015  Prior vaccinations: TD or Tdap: 2008  Influenza: 2014 Pneumococcal: 2010 Prevnar 13: DUE Shingles/Zostavax: declines due to cost  History reviewed: allergies, current medications, past family history, past medical history, past social history, past surgical history and problem list  Risk Factors: Osteoporosis/FallRisk: postmenopausal estrogen deficiency and dietary calcium and/or vitamin D deficiency In the past year have you fallen or had a near fall?:Yes History of fracture in the past year: no  Tobacco History  Substance Use Topics  . Smoking status: Never Smoker   . Smokeless tobacco: Not on file  . Alcohol Use: No   She does not smoke.  Patient is not a former smoker. Are there smokers in your home (other than you)?  No  Alcohol Current alcohol use: none  Caffeine Current caffeine use: coffee 2 /day  Exercise Current exercise: none  Nutrition/Diet Current diet: in general, a "healthy" diet    Cardiac risk factors: advanced age (older than 71 for men, 42 for women), diabetes mellitus, dyslipidemia, family history of premature cardiovascular disease, hypertension, microalbuminuria and sedentary lifestyle.  Depression Screen (Note: if answer to either of the following is "Yes", a more complete depression screening is indicated)   Q1: Over the  past two weeks, have you felt down, depressed or hopeless? No  Q2: Over the past two weeks, have you felt little interest or pleasure in doing things? No  Have you lost interest or pleasure in daily life? No  Do you often feel hopeless? No  Do you cry easily over simple problems?  No  Activities of Daily Living In your present state of health, do you have any difficulty performing the following activities?:  Driving? Yes Managing money?  Yes Feeding yourself? No Getting from bed to chair? No Climbing a flight of stairs? Yes Preparing food and eating?: Yes Bathing or showering? Yes Getting dressed: No Getting to the toilet? No Using the toilet:Yes Moving around from place to place: Yes   Are you sexually active?  No  Do you have more than one partner?  No  Vision Difficulties: No  Hearing Difficulties: No Do you often ask people to speak up or repeat themselves? No Do you experience ringing or noises in your ears? No Do you have difficulty understanding soft or whispered voices? No  Cognition  Do you feel that you have a problem with memory?Yes  Do you often misplace items? Yes  Do you feel safe at home?  Yes  Advanced directives Does patient have a [place]? Yes Does patient have a Living Will? Yes   Objective:   Blood pressure 128/70, pulse 60, temperature 97.7 F (36.5 C), resp. rate 16, weight 118 lb (53.524 kg). Body mass index is 17.95 kg/(m^2).  General appearance: alert, no distress, WD/WN,  female Cognitive Testing  Alert? Yes  Normal Appearance?Yes  Oriented to person? Yes  Place? No   Time? No  Recall of three objects?  Yes  Can perform simple calculations? Yes  Displays appropriate judgment?Yes  Can read the correct time from a watch face?Yes  HEENT: normocephalic, sclerae anicteric, TMs pearly, nares patent, no discharge or erythema, pharynx normal Oral cavity: MMM, no lesions Neck: supple, no lymphadenopathy, no thyromegaly, no masses Heart: NSR, normal S1, S2, no murmurs Lungs: CTA bilaterally, no wheezes, rhonchi, or rales Abdomen: +bs, soft, non tender, non distended, no masses, no hepatomegaly, no splenomegaly Musculoskeletal: nontender, no swelling, no obvious deformity Extremities: no edema, no  cyanosis, no clubbing Pulses: 2+ symmetric, upper and lower extremities, normal cap refill Neurological: alert, oriented x 3, CN2-12 intact, strength normal upper extremities and lower extremities, sensation normal throughout, DTRs 2+ throughout, no cerebellar signs, gait normal Psychiatric: normal affect, behavior normal, pleasant  Breast: defer Gyn: defer Rectal: defer  Medicare Attestation I have personally reviewed: The patient's medical and social history Their use of alcohol, tobacco or illicit drugs Their current medications and supplements The patient's functional ability including ADLs,fall risks, home safety risks, cognitive, and hearing and visual impairment Diet and physical activities Evidence for depression or mood disorders  The patient's weight, height, BMI, and visual acuity have been recorded in the chart.  I have made referrals, counseling, and provided education to the patient based on review of the above and I have provided the patient with a written personalized care plan for preventive services.     Vicie Mutters, PA-C   02/18/2014

## 2014-02-18 NOTE — Patient Instructions (Signed)
Memantine Tablets  What is this medicine?  MEMANTINE (MEM an teen) is used to treat dementia caused by Alzheimer's disease.  This medicine may be used for other purposes; ask your health care provider or pharmacist if you have questions.  COMMON BRAND NAME(S): Namenda  What should I tell my health care provider before I take this medicine?  They need to know if you have any of these conditions:  -difficulty passing urine  -kidney disease  -liver disease  -seizures  -an unusual or allergic reaction to memantine, other medicines, foods, dyes, or preservatives  -pregnant or trying to get pregnant  -breast-feeding  How should I use this medicine?  Take this medicine by mouth with a glass of water. Follow the directions on the prescription label. You may take this medicine with or without food. Take your doses at regular intervals. Do not take your medicine more often than directed. Continue to take your medicine even if you feel better. Do not stop taking except on the advice of your doctor or health care professional.  Talk to your pediatrician regarding the use of this medicine in children. Special care may be needed.  Overdosage: If you think you have taken too much of this medicine contact a poison control center or emergency room at once.  NOTE: This medicine is only for you. Do not share this medicine with others.  What if I miss a dose?  If you miss a dose, take it as soon as you can. If it is almost time for your next dose, take only that dose. Do not take double or extra doses. If you do not take your medicine for several days, contact your health care provider. Your dose may need to be changed.  What may interact with this medicine?  -acetazolamide  -amantadine  -cimetidine  -dextromethorphan  -dofetilide  -hydrochlorothiazide  -ketamine  -metformin  -methazolamide  -quinidine  -ranitidine  -sodium bicarbonate  -triamterene  This list may not describe all possible interactions. Give your health care provider a  list of all the medicines, herbs, non-prescription drugs, or dietary supplements you use. Also tell them if you smoke, drink alcohol, or use illegal drugs. Some items may interact with your medicine.  What should I watch for while using this medicine?  Visit your doctor or health care professional for regular checks on your progress. Check with your doctor or health care professional if there is no improvement in your symptoms or if they get worse.  You may get drowsy or dizzy. Do not drive, use machinery, or do anything that needs mental alertness until you know how this drug affects you. Do not stand or sit up quickly, especially if you are an older patient. This reduces the risk of dizzy or fainting spells. Alcohol can make you more drowsy and dizzy. Avoid alcoholic drinks.  What side effects may I notice from receiving this medicine?  Side effects that you should report to your doctor or health care professional as soon as possible:  -allergic reactions like skin rash, itching or hives, swelling of the face, lips, or tongue  -agitation or a feeling of restlessness  -depressed mood  -dizziness  -hallucinations  -redness, blistering, peeling or loosening of the skin, including inside the mouth  -seizures  -vomiting  Side effects that usually do not require medical attention (report to your doctor or health care professional if they continue or are bothersome):  -constipation  -diarrhea  -headache  -nausea  -trouble sleeping  This   list may not describe all possible side effects. Call your doctor for medical advice about side effects. You may report side effects to FDA at 1-800-FDA-1088.  Where should I keep my medicine?  Keep out of the reach of children.  Store at room temperature between 15 degrees and 30 degrees C (59 degrees and 86 degrees F). Throw away any unused medicine after the expiration date.  NOTE: This sheet is a summary. It may not cover all possible information. If you have questions about this  medicine, talk to your doctor, pharmacist, or health care provider.  © 2015, [employer]. (2013-01-22 14:10:42)

## 2014-02-19 ENCOUNTER — Telehealth: Payer: Self-pay

## 2014-02-19 NOTE — Telephone Encounter (Signed)
Left voicemail for patient to return call for lab results.

## 2014-02-19 NOTE — Telephone Encounter (Signed)
-----   Message from Vicie Mutters, [place] sent at 02/19/2014  8:19 AM EST ----- All of your labs are normal except: Chol at goal Magnesium low add 250 mg with food to prevent diarrhea. Magnesium may help with muscle cramps, constipation, vitamin D and potassium absorption.

## 2014-02-26 ENCOUNTER — Ambulatory Visit (INDEPENDENT_AMBULATORY_CARE_PROVIDER_SITE_OTHER): Payer: Medicare Other | Admitting: *Deleted

## 2014-02-26 DIAGNOSIS — Z23 Encounter for immunization: Secondary | ICD-10-CM | POA: Diagnosis not present

## 2014-03-28 ENCOUNTER — Encounter (HOSPITAL_COMMUNITY): Payer: Self-pay | Admitting: Internal Medicine

## 2014-05-02 ENCOUNTER — Other Ambulatory Visit: Payer: Self-pay | Admitting: Internal Medicine

## 2014-05-22 ENCOUNTER — Ambulatory Visit: Payer: Self-pay | Admitting: Physician Assistant

## 2014-05-23 ENCOUNTER — Encounter: Payer: Self-pay | Admitting: Physician Assistant

## 2014-05-23 ENCOUNTER — Ambulatory Visit (INDEPENDENT_AMBULATORY_CARE_PROVIDER_SITE_OTHER): Payer: Medicare Other | Admitting: Physician Assistant

## 2014-05-23 VITALS — BP 138/72 | HR 56 | Temp 98.0°F | Resp 16 | Ht 68.0 in | Wt 124.0 lb

## 2014-05-23 DIAGNOSIS — F0280 Dementia in other diseases classified elsewhere without behavioral disturbance: Secondary | ICD-10-CM

## 2014-05-23 DIAGNOSIS — R7303 Prediabetes: Secondary | ICD-10-CM

## 2014-05-23 DIAGNOSIS — I4892 Unspecified atrial flutter: Secondary | ICD-10-CM

## 2014-05-23 DIAGNOSIS — Z79899 Other long term (current) drug therapy: Secondary | ICD-10-CM

## 2014-05-23 DIAGNOSIS — R7309 Other abnormal glucose: Secondary | ICD-10-CM | POA: Diagnosis not present

## 2014-05-23 DIAGNOSIS — E039 Hypothyroidism, unspecified: Secondary | ICD-10-CM

## 2014-05-23 DIAGNOSIS — E559 Vitamin D deficiency, unspecified: Secondary | ICD-10-CM

## 2014-05-23 DIAGNOSIS — I10 Essential (primary) hypertension: Secondary | ICD-10-CM | POA: Diagnosis not present

## 2014-05-23 DIAGNOSIS — E782 Mixed hyperlipidemia: Secondary | ICD-10-CM | POA: Diagnosis not present

## 2014-05-23 DIAGNOSIS — G301 Alzheimer's disease with late onset: Secondary | ICD-10-CM

## 2014-05-23 DIAGNOSIS — R0989 Other specified symptoms and signs involving the circulatory and respiratory systems: Secondary | ICD-10-CM

## 2014-05-23 LAB — HEPATIC FUNCTION PANEL
ALT: 14 U/L (ref 0–35)
AST: 20 U/L (ref 0–37)
Albumin: 4 g/dL (ref 3.5–5.2)
Alkaline Phosphatase: 74 U/L (ref 39–117)
Bilirubin, Direct: 0.1 mg/dL (ref 0.0–0.3)
Indirect Bilirubin: 0.4 mg/dL (ref 0.2–1.2)
Total Bilirubin: 0.5 mg/dL (ref 0.2–1.2)
Total Protein: 6.6 g/dL (ref 6.0–8.3)

## 2014-05-23 LAB — CBC WITH DIFFERENTIAL/PLATELET
BASOS PCT: 0 % (ref 0–1)
Basophils Absolute: 0 10*3/uL (ref 0.0–0.1)
EOS ABS: 0 10*3/uL (ref 0.0–0.7)
Eosinophils Relative: 1 % (ref 0–5)
HEMATOCRIT: 38.1 % (ref 36.0–46.0)
HEMOGLOBIN: 12.8 g/dL (ref 12.0–15.0)
Lymphocytes Relative: 21 % (ref 12–46)
Lymphs Abs: 1 10*3/uL (ref 0.7–4.0)
MCH: 32.4 pg (ref 26.0–34.0)
MCHC: 33.6 g/dL (ref 30.0–36.0)
MCV: 96.5 fL (ref 78.0–100.0)
MONO ABS: 0.4 10*3/uL (ref 0.1–1.0)
MONOS PCT: 8 % (ref 3–12)
MPV: 9.9 fL (ref 8.6–12.4)
NEUTROS ABS: 3.2 10*3/uL (ref 1.7–7.7)
Neutrophils Relative %: 70 % (ref 43–77)
PLATELETS: 222 10*3/uL (ref 150–400)
RBC: 3.95 MIL/uL (ref 3.87–5.11)
RDW: 14.3 % (ref 11.5–15.5)
WBC: 4.6 10*3/uL (ref 4.0–10.5)

## 2014-05-23 LAB — MAGNESIUM: MAGNESIUM: 2.1 mg/dL (ref 1.5–2.5)

## 2014-05-23 LAB — LIPID PANEL
Cholesterol: 178 mg/dL (ref 0–200)
HDL: 63 mg/dL (ref >39)
LDL Cholesterol: 96 mg/dL (ref 0–99)
TRIGLYCERIDES: 94 mg/dL (ref <150)
Total CHOL/HDL Ratio: 2.8 Ratio
VLDL: 19 mg/dL (ref 0–40)

## 2014-05-23 LAB — BASIC METABOLIC PANEL WITH GFR
BUN: 22 mg/dL (ref 6–23)
CHLORIDE: 103 meq/L (ref 96–112)
CO2: 31 mEq/L (ref 19–32)
Calcium: 9.2 mg/dL (ref 8.4–10.5)
Creat: 0.96 mg/dL (ref 0.50–1.10)
GFR, Est African American: 66 mL/min
GFR, Est Non African American: 57 mL/min — ABNORMAL LOW
GLUCOSE: 84 mg/dL (ref 70–99)
POTASSIUM: 4.5 meq/L (ref 3.5–5.3)
SODIUM: 141 meq/L (ref 135–145)

## 2014-05-23 NOTE — Patient Instructions (Signed)
    Bad carbs also include fruit juice, alcohol, and sweet tea. These are empty calories that do not signal to your brain that you are full.   Please remember the good carbs are still carbs which convert into sugar. So please measure them out no more than 1/2-1 cup of rice, oatmeal, pasta, and beans  Veggies are however free foods! Pile them on.   Not all fruit is created equal. Please see the list below, the fruit at the bottom is higher in sugars than the fruit at the top. Please avoid all dried fruits.     Memory Compensation Strategies  1. Use "WARM" strategy.  W= write it down  A= associate it  R= repeat it  M= make a mental note  2.   You can keep a Memory Notebook.  Use a 3-ring notebook with sections for the following: calendar, important names and phone numbers,  medications, doctors' names/phone numbers, lists/reminders, and a section to journal what you did  each day.   3.    Use a calendar to write appointments down.  4.    Write yourself a schedule for the day.  This can be placed on the calendar or in a separate section of the Memory Notebook.  Keeping a  regular schedule can help memory.  5.    Use medication organizer with sections for each day or morning/evening pills.  You may need help loading it  6.    Keep a basket, or pegboard by the door.  Place items that you need to take out with you in the basket or on the pegboard.  You may also want to  include a message board for reminders.  7.    Use sticky notes.  Place sticky notes with reminders in a place where the task is performed.  For example: " turn off the  stove" placed by the stove, "lock the door" placed on the door at eye level, " take your medications" on  the bathroom mirror or by the place where you normally take your medications.  8.    Use alarms/timers.  Use while cooking to remind yourself to check on food or as a reminder to take your medicine, or as a  reminder to make a call, or as a reminder  to perform another task, etc.  

## 2014-05-23 NOTE — Progress Notes (Signed)
Assessment and Plan:  Hypertension: Continue medication, monitor blood pressure at home. Continue DASH diet.  Reminder to go to the ER if any CP, SOB, nausea, dizziness, severe HA, changes vision/speech, left arm numbness and tingling, and jaw pain. Cholesterol: Continue diet and exercise. Check cholesterol.  Pre-diabetes-Continue diet and exercise. Check A1C Vitamin D Def- check level and continue medications. Dementia- continue aricept   Continue diet and meds as discussed. Further disposition pending results of labs.  HPI 80 y.o. female  presents for 3 month follow up with hypertension, dementia, hyperlipidemia, prediabetes and vitamin D. She has dementia and is on Aricept, her husbnad is primary care giver and gives the majority of the history. Marland Kitchen   Her blood pressure has been controlled at home, today their BP is BP: 138/72 mmHg  She does not workout, very rarely walks. She denies chest pain, shortness of breath, dizziness.  She is not on cholesterol medication and denies myalgias. Her cholesterol is at goal. The cholesterol last visit was:   Lab Results  Component Value Date   CHOL 169 02/18/2014   HDL 62 02/18/2014   LDLCALC 89 02/18/2014   TRIG 88 02/18/2014   CHOLHDL 2.7 02/18/2014    She has been working on diet and exercise for prediabetes, and denies paresthesia of the feet, polydipsia, polyuria and visual disturbances. Last A1C in the office was:  Lab Results  Component Value Date   HGBA1C 5.6 08/13/2013  Patient is on Vitamin D supplement.   Lab Results  Component Value Date   VD25OH 62 08/13/2013  She had a history of Aflutter, s/p ablation May 2015 with Dr. Lovena Le, NSR.  She is on thyroid medication. Her medication was not changed last visit.   Lab Results  Component Value Date   TSH 3.076 02/18/2014   Wt Readings from Last 3 Encounters:  05/23/14 124 lb (56.246 kg)  02/18/14 118 lb (53.524 kg)  09/27/13 121 lb (54.885 kg)     Current Medications:  Current  Outpatient Prescriptions on File Prior to Visit  Medication Sig Dispense Refill  . calcium carbonate (TUMS - DOSED IN MG ELEMENTAL CALCIUM) 500 MG chewable tablet Chew 2 tablets by mouth 2 (two) times daily.    . Cholecalciferol (VITAMIN D PO) Take 2,000 Units by mouth 2 (two) times daily.     Marland Kitchen donepezil (ARICEPT) 10 MG tablet TAKE 1 TABLET EVERY DAY FOR MEMORY. 90 tablet 3  . levothyroxine (SYNTHROID, LEVOTHROID) 50 MCG tablet TAKE 1 TABLET EVERY DAY FOR THYROID 90 tablet 3   No current facility-administered medications on file prior to visit.   Medical History:  Past Medical History  Diagnosis Date  . Labile hypertension   . Hypothyroid   . IBS (irritable bowel syndrome)   . Vitamin D deficiency   . Breast cancer 1970    Right  . Dementia     moderate  . Atrial flutter    Allergies: No Known Allergies   Review of Systems:  Review of Systems  Constitutional: Negative.   HENT: Negative.   Eyes: Negative.   Respiratory: Negative.   Cardiovascular: Negative.   Gastrointestinal: Negative.   Genitourinary: Negative.   Musculoskeletal: Negative.   Skin: Negative.   Neurological: Negative.   Endo/Heme/Allergies: Negative.   Psychiatric/Behavioral: Negative.     Family history- Review and unchanged Social history- Review and unchanged Physical Exam: BP 138/72 mmHg  Pulse 56  Temp(Src) 98 F (36.7 C)  Resp 16  Ht 5\' 8"  (1.727 m)  Wt 124 lb (56.246 kg)  BMI 18.86 kg/m2 Wt Readings from Last 3 Encounters:  05/23/14 124 lb (56.246 kg)  02/18/14 118 lb (53.524 kg)  09/27/13 121 lb (54.885 kg)   General Appearance: Well nourished, in no apparent distress. Eyes: PERRLA, EOMs, conjunctiva no swelling or erythema Sinuses: No Frontal/maxillary tenderness ENT/Mouth: Ext aud canals clear, TMs without erythema, bulging. No erythema, swelling, or exudate on post pharynx.  Tonsils not swollen or erythematous. Hearing normal.  Neck: Supple, thyroid normal.  Respiratory:  Respiratory effort normal, BS equal bilaterally without rales, rhonchi, wheezing or stridor.  Cardio: RRR with no MRGs. Brisk peripheral pulses with 1+edema.  Abdomen: Soft, + BS,  Non tender, no guarding, rebound, hernias, masses. Lymphatics: Non tender without lymphadenopathy.  Musculoskeletal: Full ROM, 5/5 strength, Shuffling gait but steady Skin: Warm, dry without rashes, lesions, ecchymosis.  Neuro: Cranial nerves intact. Normal muscle tone, no cerebellar symptoms. Psych: Awake and oriented X 1, normal affect, has dementia   Vicie Mutters, PA-C 11:35 AM [facility]

## 2014-05-24 LAB — HEMOGLOBIN A1C
Hgb A1c MFr Bld: 5.5 % (ref <5.7)
Mean Plasma Glucose: 111 mg/dL (ref <117)

## 2014-05-24 LAB — TSH: TSH: 3.511 u[IU]/mL (ref 0.350–4.500)

## 2014-08-14 ENCOUNTER — Encounter: Payer: Self-pay | Admitting: Internal Medicine

## 2014-08-20 ENCOUNTER — Encounter: Payer: Self-pay | Admitting: Internal Medicine

## 2014-09-01 ENCOUNTER — Encounter: Payer: Self-pay | Admitting: Internal Medicine

## 2014-09-01 NOTE — Patient Instructions (Signed)
+++++++++++++++++++  Recommend Low dose or baby Aspirin 81 mg daily   To reduce risk of Colon Cancer 20 %, Skin Cancer 26 % , Melanoma 46% and   Pancreatic cancer 60%  ++++++++++++++++++++ Vitamin D goal is between 70-100.   Please make sure that you are taking your Vitamin D as directed.   It is very important as a natural antiinflammatory   helping hair, skin, and nails, as well as reducing stroke and heart attack risk.   It helps your bones and helps with mood.  It also decreases numerous cancer risks so please take it as directed.   Low Vit D is associated with a 200-300% higher risk for CANCER   and 200-300% higher risk for HEART   ATTACK  &  STROKE.    ................................................  It is also associated with higher death rate at younger ages,   autoimmune diseases like Rheumatoid arthritis, Lupus, Multiple Sclerosis.     Also many other serious conditions, like depression, Alzheimer's  Dementia, infertility, muscle aches, fatigue, fibromyalgia - just to name a few.  +++++++++++++++ Recommend the book "The END of DIETING" by Dr Joel Fuhrman   & the book "The END of DIABETES " by Dr Joel Fuhrman  At [employer] - get book & Audio CD's     Being diabetic has a  300% increased risk for heart attack, stroke, cancer, and alzheimer- type vascular dementia. It is very important that you work harder with diet by avoiding all foods that are white. Avoid white rice (brown & wild rice is OK), white potatoes (sweetpotatoes in moderation is OK), White bread or wheat bread or anything made out of white flour like bagels, donuts, rolls, buns, biscuits, cakes, pastries, cookies, pizza crust, and pasta (made from white flour & egg whites) - vegetarian pasta or spinach or wheat pasta is OK. Multigrain breads like Arnold's or [employer], or multigrain sandwich thins or flatbreads.  Diet, exercise and weight loss can reverse and cure diabetes in the early stages.   Diet, exercise and weight loss is very important in the control and prevention of complications of diabetes which affects every system in your body, ie. Brain - dementia/stroke, eyes - glaucoma/blindness, heart - heart attack/heart failure, kidneys - dialysis, stomach - gastric paralysis, intestines - malabsorption, nerves - severe painful neuritis, circulation - gangrene & loss of a leg(s), and finally cancer and Alzheimers.    I recommend avoid fried & greasy foods,  sweets/candy, white rice (brown or wild rice or Quinoa is OK), white potatoes (sweet potatoes are OK) - anything made from white flour - bagels, doughnuts, rolls, buns, biscuits,white and wheat breads, pizza crust and traditional pasta made of white flour & egg white(vegetarian pasta or spinach or wheat pasta is OK).  Multi-grain bread is OK - like multi-grain flat bread or sandwich thins. Avoid alcohol in excess. Exercise is also important.    Eat all the vegetables you want - avoid meat, especially red meat and dairy - especially cheese.  Cheese is the most concentrated form of trans-fats which is the worst thing to clog up our arteries. Veggie cheese is OK which can be found in the fresh produce section at [employer] or [employer] or [employer]  ++++++++++++++++++++++++++++++++++ Preventive Care for Adults  A healthy lifestyle and preventive care can promote health and wellness. Preventive health guidelines for women include the following key practices.  A routine yearly physical is a good way to check with your health care provider about   your health and preventive screening. It is a chance to share any concerns and updates on your health and to receive a thorough exam.  Visit your dentist for a routine exam and preventive care every 6 months. Brush your teeth twice a day and floss once a day. Good oral hygiene prevents tooth decay and gum disease.  The frequency of eye exams is based on your age, health, family medical history,  use of contact lenses, and other factors. Follow your health care provider's recommendations for frequency of eye exams.  Eat a healthy diet. Foods like vegetables, fruits, whole grains, low-fat dairy products, and lean protein foods contain the nutrients you need without too many calories. Decrease your intake of foods high in solid fats, added sugars, and salt. Eat the right amount of calories for you.Get information about a proper diet from your health care provider, if necessary.  Regular physical exercise is one of the most important things you can do for your health. Most adults should get at least 150 minutes of moderate-intensity exercise (any activity that increases your heart rate and causes you to sweat) each week. In addition, most adults need muscle-strengthening exercises on 2 or more days a week.  Maintain a healthy weight. The body mass index (BMI) is a screening tool to identify possible weight problems. It provides an estimate of body fat based on height and weight. Your health care provider can find your BMI and can help you achieve or maintain a healthy weight.For adults 20 years and older:  A BMI below 18.5 is considered underweight.  A BMI of 18.5 to 24.9 is normal.  A BMI of 25 to 29.9 is considered overweight.  A BMI of 30 and above is considered obese.  Maintain normal blood lipids and cholesterol levels by exercising and minimizing your intake of saturated fat. Eat a balanced diet with plenty of fruit and vegetables. If your lipid or cholesterol levels are high, you are over 50, or you are at high risk for heart disease, you may need your cholesterol levels checked more frequently.Ongoing high lipid and cholesterol levels should be treated with medicines if diet and exercise are not working.  If you smoke, find out from your health care provider how to quit. If you do not use tobacco, do not start.  Lung cancer screening is recommended for adults aged 55-80 years who  are at high risk for developing lung cancer because of a history of smoking. A yearly low-dose CT scan of the lungs is recommended for people who have at least a 30-pack-year history of smoking and are a current smoker or have quit within the past 15 years. A pack year of smoking is smoking an average of 1 pack of cigarettes a day for 1 year (for example: 1 pack a day for 30 years or 2 packs a day for 15 years). Yearly screening should continue until the smoker has stopped smoking for at least 15 years. Yearly screening should be stopped for people who develop a health problem that would prevent them from having lung cancer treatment.  Avoid use of street drugs. Do not share needles with anyone. Ask for help if you need support or instructions about stopping the use of drugs.  High blood pressure causes heart disease and increases the risk of stroke.  Ongoing high blood pressure should be treated with medicines if weight loss and exercise do not work.  If you are 55-79 years old, ask your health care provider if   you should take aspirin to prevent strokes.  Diabetes screening involves taking a blood sample to check your fasting blood sugar level. This should be done once every 3 years, after age 45, if you are within normal weight and without risk factors for diabetes. Testing should be considered at a younger age or be carried out more frequently if you are overweight and have at least 1 risk factor for diabetes.  Breast cancer screening is essential preventive care for women. You should practice "breast self-awareness." This means understanding the normal appearance and feel of your breasts and may include breast self-examination. Any changes detected, no matter how small, should be reported to a health care provider. Women in their 20s and 30s should have a clinical breast exam (CBE) by a health care provider as part of a regular health exam every 1 to 3 years. After age 40, women should have a CBE every  year. Starting at age 40, women should consider having a mammogram (breast X-ray test) every year. Women who have a family history of breast cancer should talk to their health care provider about genetic screening. Women at a high risk of breast cancer should talk to their health care providers about having an MRI and a mammogram every year.  Breast cancer gene (BRCA)-related cancer risk assessment is recommended for women who have family members with BRCA-related cancers. BRCA-related cancers include breast, ovarian, tubal, and peritoneal cancers. Having family members with these cancers may be associated with an increased risk for harmful changes (mutations) in the breast cancer genes BRCA1 and BRCA2. Results of the assessment will determine the need for genetic counseling and BRCA1 and BRCA2 testing.  Routine pelvic exams to screen for cancer are no longer recommended for nonpregnant women who are considered low risk for cancer of the pelvic organs (ovaries, uterus, and vagina) and who do not have symptoms. Ask your health care provider if a screening pelvic exam is right for you.  If you have had past treatment for cervical cancer or a condition that could lead to cancer, you need Pap tests and screening for cancer for at least 20 years after your treatment. If Pap tests have been discontinued, your risk factors (such as having a new sexual partner) need to be reassessed to determine if screening should be resumed. Some women have medical problems that increase the chance of getting cervical cancer. In these cases, your health care provider may recommend more frequent screening and Pap tests.    Colorectal cancer can be detected and often prevented. Most routine colorectal cancer screening begins at the age of 50 years and continues through age 75 years. However, your health care provider may recommend screening at an earlier age if you have risk factors for colon cancer. On a yearly basis, your health  care provider may provide home test kits to check for hidden blood in the stool. Use of a small camera at the end of a tube, to directly examine the colon (sigmoidoscopy or colonoscopy), can detect the earliest forms of colorectal cancer. Talk to your health care provider about this at age 50, when routine screening begins. Direct exam of the colon should be repeated every 5-10 years through age 75 years, unless early forms of pre-cancerous polyps or small growths are found.  Osteoporosis is a disease in which the bones lose minerals and strength with aging. This can result in serious bone fractures or breaks. The risk of osteoporosis can be identified using a bone density scan.   Women ages 65 years and over and women at risk for fractures or osteoporosis should discuss screening with their health care providers. Ask your health care provider whether you should take a calcium supplement or vitamin D to reduce the rate of osteoporosis.  Menopause can be associated with physical symptoms and risks. Hormone replacement therapy is available to decrease symptoms and risks. You should talk to your health care provider about whether hormone replacement therapy is right for you.  Use sunscreen. Apply sunscreen liberally and repeatedly throughout the day. You should seek shade when your shadow is shorter than you. Protect yourself by wearing long sleeves, pants, a wide-brimmed hat, and sunglasses year round, whenever you are outdoors.  Once a month, do a whole body skin exam, using a mirror to look at the skin on your back. Tell your health care provider of new moles, moles that have irregular borders, moles that are larger than a pencil eraser, or moles that have changed in shape or color.  Stay current with required vaccines (immunizations).  Influenza vaccine. All adults should be immunized every year.  Tetanus, diphtheria, and acellular pertussis (Td, Tdap) vaccine. Pregnant women should receive 1 dose of  Tdap vaccine during each pregnancy. The dose should be obtained regardless of the length of time since the last dose. Immunization is preferred during the 27th-36th week of gestation. An adult who has not previously received Tdap or who does not know her vaccine status should receive 1 dose of Tdap. This initial dose should be followed by tetanus and diphtheria toxoids (Td) booster doses every 10 years. Adults with an unknown or incomplete history of completing a 3-dose immunization series with Td-containing vaccines should begin or complete a primary immunization series including a Tdap dose. Adults should receive a Td booster every 10 years.    Zoster vaccine. One dose is recommended for adults aged 60 years or older unless certain conditions are present.    Pneumococcal 13-valent conjugate (PCV13) vaccine. When indicated, a person who is uncertain of her immunization history and has no record of immunization should receive the PCV13 vaccine. An adult aged 19 years or older who has certain medical conditions and has not been previously immunized should receive 1 dose of PCV13 vaccine. This PCV13 should be followed with a dose of pneumococcal polysaccharide (PPSV23) vaccine. The PPSV23 vaccine dose should be obtained at least 8 weeks after the dose of PCV13 vaccine. An adult aged 19 years or older who has certain medical conditions and previously received 1 or more doses of PPSV23 vaccine should receive 1 dose of PCV13. The PCV13 vaccine dose should be obtained 1 or more years after the last PPSV23 vaccine dose.    Pneumococcal polysaccharide (PPSV23) vaccine. When PCV13 is also indicated, PCV13 should be obtained first. All adults aged 65 years and older should be immunized. An adult younger than age 65 years who has certain medical conditions should be immunized. Any person who resides in a nursing home or long-term care facility should be immunized. An adult smoker should be immunized. People with an  immunocompromised condition and certain other conditions should receive both PCV13 and PPSV23 vaccines. People with human immunodeficiency virus (HIV) infection should be immunized as soon as possible after diagnosis. Immunization during chemotherapy or radiation therapy should be avoided. Routine use of PPSV23 vaccine is not recommended for American Indians, [place] Natives, or people younger than 65 years unless there are medical conditions that require PPSV23 vaccine. When indicated, people who have unknown   immunization and have no record of immunization should receive PPSV23 vaccine. One-time revaccination 5 years after the first dose of PPSV23 is recommended for people aged 19-64 years who have chronic kidney failure, nephrotic syndrome, asplenia, or immunocompromised conditions. People who received 1-2 doses of PPSV23 before age 65 years should receive another dose of PPSV23 vaccine at age 65 years or later if at least 5 years have passed since the previous dose. Doses of PPSV23 are not needed for people immunized with PPSV23 at or after age 65 years.   Preventive Services / Frequency  Ages 65 years and over  Blood pressure check.  Lipid and cholesterol check.  Lung cancer screening. / Every year if you are aged 55-80 years and have a 30-pack-year history of smoking and currently smoke or have quit within the past 15 years. Yearly screening is stopped once you have quit smoking for at least 15 years or develop a health problem that would prevent you from having lung cancer treatment.  Clinical breast exam.** / Every year after age 40 years.  BRCA-related cancer risk assessment.** / For women who have family members with a BRCA-related cancer (breast, ovarian, tubal, or peritoneal cancers).  Mammogram.** / Every year beginning at age 40 years and continuing for as long as you are in good health. Consult with your health care provider.  Pap test.** / Every 3 years starting at age 30 years  through age 65 or 70 years with 3 consecutive normal Pap tests. Testing can be stopped between 65 and 70 years with 3 consecutive normal Pap tests and no abnormal Pap or HPV tests in the past 10 years.  Fecal occult blood test (FOBT) of stool. / Every year beginning at age 50 years and continuing until age 75 years. You may not need to do this test if you get a colonoscopy every 10 years.  Flexible sigmoidoscopy or colonoscopy.** / Every 5 years for a flexible sigmoidoscopy or every 10 years for a colonoscopy beginning at age 50 years and continuing until age 75 years.  Hepatitis C blood test.** / For all people born from 1945 through 1965 and any individual with known risks for hepatitis C.  Osteoporosis screening.** / A one-time screening for women ages 65 years and over and women at risk for fractures or osteoporosis.  Skin self-exam. / Monthly.  Influenza vaccine. / Every year.  Tetanus, diphtheria, and acellular pertussis (Tdap/Td) vaccine.** / 1 dose of Td every 10 years.  Zoster vaccine.** / 1 dose for adults aged 60 years or older.  Pneumococcal 13-valent conjugate (PCV13) vaccine.** / Consult your health care provider.  Pneumococcal polysaccharide (PPSV23) vaccine.** / 1 dose for all adults aged 65 years and older. Screening for abdominal aortic aneurysm (AAA)  by ultrasound is recommended for people who have history of high blood pressure or who are current or former smokers. 

## 2014-09-01 NOTE — Progress Notes (Signed)
Patient ID: Amber Chan, female   DOB: December 18, 1934, 80 y.o.   MRN: [id number]  Annual Comprehensive Examination  This very nice 80 y.o.  MWF presents for complete physical.  Patient has been followed for HTN, Prediabetes, Hyperlipidemia, and Vitamin D Deficiency. Patient has hx of Right breast cancer and underwent bilat mastectomies ([place]). Patient has been on thyroid replacement since 1999. Also, patient has Mild SDAT and requires moderate supervision by her husband, altho she has no problems with personal hygiene or ADL's she does require assistance prompting her to dress .     Patient has hx/o labile elevated BP and has been monitored expectantly.  In May 2015, patient had Atrial Flutter and had RFA by Dr Lovena Le and has done well since. Patient's BP has been controlled at home and patient denies any cardiac symptoms as chest pain, palpitations, shortness of breath, dizziness or ankle swelling. Today's BP: 116/60 mmHg    Patient's hyperlipidemia is controlled with diet. Last lipids were at goal - Total Chol 178; HDL 63; LDL 96; Trig 94 on 05/23/2014.   Patient is screened for prediabetes predating and patient denies reactive hypoglycemic symptoms, visual blurring, diabetic polys, or paresthesias. Last A1c was 5.5% on 05/23/2014.   Finally, patient has history of Vitamin D Deficiency of 22 in 2008 and last Vitamin D was 81 in Apr 2015.      Medication Sig  . TUMS 500 MG chewable tablet Chew 2 tablets by mouth 2 (two) times daily.  Marland Kitchen VITAMIN D  Take 2,000 Units by mouth 2 (two) times daily.   Marland Kitchen donepezil 10 MG tablet TAKE 1 TABLET EVERY DAY FOR MEMORY.  Marland Kitchen levothyroxine  50 MCG tablet TAKE 1 TABLET EVERY DAY FOR THYROID   No Known Allergies   Past Medical History  Diagnosis Date  . Labile hypertension   . Hypothyroid   . IBS (irritable bowel syndrome)   . Vitamin D deficiency   . Breast cancer 1978    Right  . Dementia     moderate  . Atrial flutter    Health Maintenance   Topic Date Due  . COLONOSCOPY  11/24/1986  . ZOSTAVAX  11/23/1996  . DEXA SCAN  11/23/2001  . INFLUENZA VACCINE  11/18/2014  . TETANUS/TDAP  04/20/2016  . PNA vac Low Risk Adult  Completed   Immunization History  Administered Date(s) Administered  . Influenza Split 02/13/2013  . Influenza, High Dose Seasonal PF 02/26/2014  . Influenza-Unspecified 01/17/2013  . Pneumococcal Conjugate-13 02/18/2014  . Pneumococcal Polysaccharide-23 09/02/2008  . Td 04/20/2006   Past Surgical History  Procedure Laterality Date  . Mastectomy      right 1978 left 1980  . Cholecystectomy      1988  . Biopsy thyroid      1999  . Ablation  08-28-2013    RFCA ablation of atrial flutter by Dr Lovena Le  . Atrial flutter ablation N/A 08/28/2013    Procedure: ATRIAL FLUTTER ABLATION;  Surgeon: Evans Lance, MD;  Location: [facility] CATH LAB;  Service: Cardiovascular;  Laterality: N/A;   Family History  Problem Relation Age of Onset  . Hypertension Mother   . Cancer Mother     colon  . Stroke Father   . Heart disease Father   . Diabetes Brother    History  Substance Use Topics  . Smoking status: Never Smoker   . Smokeless tobacco: Not on file  . Alcohol Use: No    ROS  (Patient's  dementia precludes reliable responses and patient's spouse provides information)   Constitutional: Denies fever, chills, weight loss/gain, headaches, insomnia,  night sweats, and change in appetite. Does c/o fatigue. Eyes: Denies redness, blurred vision, diplopia, discharge, itchy, watery eyes.  ENT: Denies discharge, congestion, post nasal drip, epistaxis, sore throat, earache, hearing loss, dental pain, Tinnitus, Vertigo, Sinus pain, snoring.  Cardio: Denies chest pain, palpitations, irregular heartbeat, syncope, dyspnea, diaphoresis, orthopnea, PND, claudication, edema Respiratory: denies cough, dyspnea, DOE, pleurisy, hoarseness, laryngitis, wheezing.  Gastrointestinal: Denies dysphagia, heartburn, reflux, water brash,  pain, cramps, nausea, vomiting, bloating, diarrhea, constipation, hematemesis, melena, hematochezia, jaundice, hemorrhoids Genitourinary: Denies dysuria, frequency, urgency, nocturia, hesitancy, discharge, hematuria, flank pain  Musculoskeletal: Denies arthralgia, myalgia, stiffness, Jt. Swelling, pain, limp, and strain/sprain. Denies falls. Skin: Denies puritis, rash, hives, warts, acne, eczema, changing in skin lesion Neuro: No weakness, tremor, incoordination, spasms, paresthesia, pain Psychiatric: Denies confusion, memory loss, sensory loss. Denies Depression. Endocrine: Denies change in weight, skin, hair change, nocturia, and paresthesia, diabetic polys, visual blurring, hyper / hypo glycemic episodes.  Heme/Lymph: No excessive bleeding, bruising, enlarged lymph nodes.  Physical Exam  BP 116/60   Pulse 72  Temp 97.5 F   Resp 16  Ht 5\' 9"   Wt 118 lb 6.4 oz     BMI 17.48  General Appearance: Thin older white female in no distress. Eyes: PERRLA, EOMs, conjunctiva no swelling or erythema, normal fundi and vessels. Sinuses: No frontal/maxillary tenderness ENT/Mouth: EACs patent / TMs  nl. Nares clear without erythema, swelling, mucoid exudates. Oral hygiene is good. No erythema, swelling, or exudate. Tongue normal, non-obstructing. Tonsils not swollen or erythematous. Hearing normal.  Neck: Supple, thyroid normal. No bruits, nodes or JVD. Respiratory: Respiratory effort normal.  BS equal and clear bilateral without rales, rhonci, wheezing or stridor. Cardio: Heart sounds are normal with regular rate and rhythm and no murmurs, rubs or gallops. Peripheral pulses are normal and equal bilaterally without edema. No aortic or femoral bruits. Chest: symmetric with normal excursions and percussion. Breasts: Bilat surgically absent w/o signs suspect for local recurrence.  Abdomen: Flat, soft, with bowl sounds. Nontender, no guarding, rebound, hernias, masses, or organomegaly.  Lymphatics: Non  tender without lymphadenopathy.   Musculoskeletal: Full ROM all peripheral extremities, joint stability, 5/5 strength, and normal gait. Skin: Warm and dry without rashes, lesions, cyanosis, clubbing or  ecchymosis.  Neuro: Cranial nerves intact, reflexes equal bilaterally. Normal muscle tone, no cerebellar symptoms. Sensation intact.  Pysch: Awake and oriented X 2, flat affect and Insight and Judgment are moderately limited.   Assessment and Plan  1. Labile hypertension  - EKG 12-Lead - [place], RETROPERITNL ABD,  LTD - TSH  2. Hyperlipidemia  - Lipid panel  3. Prediabetes  - Hemoglobin A1c - Insulin, random  4. Vitamin D deficiency  - Vit D  25 hydroxy (rtn osteoporosis monitoring)  5. Hypothyroidism (1999)  - TSH  6. Atrial flutter, hx (2015)   - s/p Ablation ( Dr Lovena Le)    7. IBS (irritable bowel syndrome)   8. SDAT (senile dementia of Alzheimer's type), Moderate   9. Breast cancer, right (197*)   - s/p Bilat Mastectomies  10. Screening for rectal cancer  - POC Hemoccult Bld/Stl   11. Medication management  - Urine Microscopic - CBC with Differential/Platelet - BASIC METABOLIC PANEL WITH GFR - Hepatic function panel - Magnesium  12. Depression screen  - Screen Negative  13. At low risk for fall   Continue prudent diet as discussed, weight control,  BP monitoring, regular exercise, and medications. Discussed med's effects and SE's. Screening labs and tests as requested with regular follow-up as recommended. Over 40 minutes of exam, counseling, chart review was performed.

## 2014-09-02 ENCOUNTER — Ambulatory Visit (INDEPENDENT_AMBULATORY_CARE_PROVIDER_SITE_OTHER): Payer: Medicare Other | Admitting: Internal Medicine

## 2014-09-02 ENCOUNTER — Encounter: Payer: Self-pay | Admitting: Internal Medicine

## 2014-09-02 VITALS — BP 116/60 | HR 72 | Temp 97.5°F | Resp 16 | Ht 69.0 in | Wt 118.4 lb

## 2014-09-02 DIAGNOSIS — Z79899 Other long term (current) drug therapy: Secondary | ICD-10-CM

## 2014-09-02 DIAGNOSIS — C50911 Malignant neoplasm of unspecified site of right female breast: Secondary | ICD-10-CM

## 2014-09-02 DIAGNOSIS — E039 Hypothyroidism, unspecified: Secondary | ICD-10-CM | POA: Diagnosis not present

## 2014-09-02 DIAGNOSIS — E559 Vitamin D deficiency, unspecified: Secondary | ICD-10-CM

## 2014-09-02 DIAGNOSIS — I10 Essential (primary) hypertension: Secondary | ICD-10-CM

## 2014-09-02 DIAGNOSIS — R7309 Other abnormal glucose: Secondary | ICD-10-CM

## 2014-09-02 DIAGNOSIS — K589 Irritable bowel syndrome without diarrhea: Secondary | ICD-10-CM

## 2014-09-02 DIAGNOSIS — E782 Mixed hyperlipidemia: Secondary | ICD-10-CM

## 2014-09-02 DIAGNOSIS — R0989 Other specified symptoms and signs involving the circulatory and respiratory systems: Secondary | ICD-10-CM

## 2014-09-02 DIAGNOSIS — I4892 Unspecified atrial flutter: Secondary | ICD-10-CM

## 2014-09-02 DIAGNOSIS — Z9181 History of falling: Secondary | ICD-10-CM

## 2014-09-02 DIAGNOSIS — Z1331 Encounter for screening for depression: Secondary | ICD-10-CM

## 2014-09-02 DIAGNOSIS — F0280 Dementia in other diseases classified elsewhere without behavioral disturbance: Secondary | ICD-10-CM

## 2014-09-02 DIAGNOSIS — G301 Alzheimer's disease with late onset: Secondary | ICD-10-CM

## 2014-09-02 DIAGNOSIS — Z1212 Encounter for screening for malignant neoplasm of rectum: Secondary | ICD-10-CM

## 2014-09-02 DIAGNOSIS — R7303 Prediabetes: Secondary | ICD-10-CM

## 2014-09-02 LAB — HEPATIC FUNCTION PANEL
ALK PHOS: 65 U/L (ref 39–117)
ALT: 11 U/L (ref 0–35)
AST: 17 U/L (ref 0–37)
Albumin: 3.9 g/dL (ref 3.5–5.2)
BILIRUBIN INDIRECT: 0.3 mg/dL (ref 0.2–1.2)
Bilirubin, Direct: 0.2 mg/dL (ref 0.0–0.3)
TOTAL PROTEIN: 6.6 g/dL (ref 6.0–8.3)
Total Bilirubin: 0.5 mg/dL (ref 0.2–1.2)

## 2014-09-02 LAB — BASIC METABOLIC PANEL WITH GFR
BUN: 16 mg/dL (ref 6–23)
CALCIUM: 9.2 mg/dL (ref 8.4–10.5)
CO2: 30 mEq/L (ref 19–32)
Chloride: 102 mEq/L (ref 96–112)
Creat: 0.95 mg/dL (ref 0.50–1.10)
GFR, EST NON AFRICAN AMERICAN: 58 mL/min — AB
GFR, Est African American: 67 mL/min
Glucose, Bld: 56 mg/dL — ABNORMAL LOW (ref 70–99)
Potassium: 4.1 mEq/L (ref 3.5–5.3)
SODIUM: 142 meq/L (ref 135–145)

## 2014-09-02 LAB — CBC WITH DIFFERENTIAL/PLATELET
BASOS ABS: 0 10*3/uL (ref 0.0–0.1)
Basophils Relative: 0 % (ref 0–1)
Eosinophils Absolute: 0.1 10*3/uL (ref 0.0–0.7)
Eosinophils Relative: 1 % (ref 0–5)
HCT: 38.5 % (ref 36.0–46.0)
Hemoglobin: 13.4 g/dL (ref 12.0–15.0)
Lymphocytes Relative: 16 % (ref 12–46)
Lymphs Abs: 0.8 10*3/uL (ref 0.7–4.0)
MCH: 33.7 pg (ref 26.0–34.0)
MCHC: 34.8 g/dL (ref 30.0–36.0)
MCV: 96.7 fL (ref 78.0–100.0)
MONO ABS: 0.5 10*3/uL (ref 0.1–1.0)
MPV: 9.7 fL (ref 8.6–12.4)
Monocytes Relative: 9 % (ref 3–12)
NEUTROS PCT: 74 % (ref 43–77)
Neutro Abs: 3.7 10*3/uL (ref 1.7–7.7)
Platelets: 230 10*3/uL (ref 150–400)
RBC: 3.98 MIL/uL (ref 3.87–5.11)
RDW: 14.5 % (ref 11.5–15.5)
WBC: 5 10*3/uL (ref 4.0–10.5)

## 2014-09-02 LAB — HEMOGLOBIN A1C
HEMOGLOBIN A1C: 5.6 % (ref <5.7)
Mean Plasma Glucose: 114 mg/dL (ref <117)

## 2014-09-02 LAB — LIPID PANEL
Cholesterol: 172 mg/dL (ref 0–200)
HDL: 55 mg/dL (ref >=46)
LDL CALC: 98 mg/dL (ref 0–99)
Total CHOL/HDL Ratio: 3.1 Ratio
Triglycerides: 94 mg/dL (ref <150)
VLDL: 19 mg/dL (ref 0–40)

## 2014-09-02 LAB — TSH: TSH: 2.799 u[IU]/mL (ref 0.350–4.500)

## 2014-09-02 LAB — MAGNESIUM: MAGNESIUM: 1.9 mg/dL (ref 1.5–2.5)

## 2014-09-03 ENCOUNTER — Other Ambulatory Visit: Payer: Self-pay | Admitting: Internal Medicine

## 2014-09-03 DIAGNOSIS — N3090 Cystitis, unspecified without hematuria: Secondary | ICD-10-CM

## 2014-09-03 LAB — URINALYSIS, MICROSCOPIC ONLY
CASTS: NONE SEEN
CRYSTALS: NONE SEEN
Squamous Epithelial / LPF: NONE SEEN

## 2014-09-03 LAB — VITAMIN D 25 HYDROXY (VIT D DEFICIENCY, FRACTURES): VIT D 25 HYDROXY: 61 ng/mL (ref 30–100)

## 2014-09-03 LAB — INSULIN, RANDOM: INSULIN: 7.1 u[IU]/mL (ref 2.0–19.6)

## 2014-09-03 MED ORDER — CIPROFLOXACIN HCL 250 MG PO TABS: ORAL_TABLET | ORAL | Status: AC

## 2014-09-06 ENCOUNTER — Other Ambulatory Visit (INDEPENDENT_AMBULATORY_CARE_PROVIDER_SITE_OTHER): Payer: Medicare Other

## 2014-09-06 DIAGNOSIS — Z1212 Encounter for screening for malignant neoplasm of rectum: Secondary | ICD-10-CM | POA: Diagnosis not present

## 2014-09-06 LAB — POC HEMOCCULT BLD/STL (HOME/3-CARD/SCREEN)
Card #2 Fecal Occult Blod, POC: NEGATIVE
Card #3 Fecal Occult Blood, POC: NEGATIVE
Fecal Occult Blood, POC: NEGATIVE

## 2014-10-08 ENCOUNTER — Ambulatory Visit (INDEPENDENT_AMBULATORY_CARE_PROVIDER_SITE_OTHER): Payer: Medicare Other | Admitting: *Deleted

## 2014-10-08 DIAGNOSIS — N390 Urinary tract infection, site not specified: Secondary | ICD-10-CM

## 2014-10-09 LAB — URINALYSIS, MICROSCOPIC ONLY
BACTERIA UA: NONE SEEN
CASTS: NONE SEEN
CRYSTALS: NONE SEEN
Squamous Epithelial / LPF: NONE SEEN

## 2014-10-09 LAB — URINALYSIS, ROUTINE W REFLEX MICROSCOPIC
BILIRUBIN URINE: NEGATIVE
Glucose, UA: NEGATIVE mg/dL
Hgb urine dipstick: NEGATIVE
Nitrite: NEGATIVE
PROTEIN: NEGATIVE mg/dL
Specific Gravity, Urine: 1.02 (ref 1.005–1.030)
Urobilinogen, UA: 0.2 mg/dL (ref 0.0–1.0)
pH: 6 (ref 5.0–8.0)

## 2014-10-09 LAB — URINE CULTURE
Colony Count: NO GROWTH
ORGANISM ID, BACTERIA: NO GROWTH

## 2014-12-05 ENCOUNTER — Encounter: Payer: Self-pay | Admitting: Internal Medicine

## 2014-12-05 ENCOUNTER — Ambulatory Visit (INDEPENDENT_AMBULATORY_CARE_PROVIDER_SITE_OTHER): Payer: Medicare Other | Admitting: Internal Medicine

## 2014-12-05 VITALS — BP 122/60 | HR 56 | Temp 97.8°F | Resp 16 | Ht 68.0 in | Wt 117.0 lb

## 2014-12-05 DIAGNOSIS — E039 Hypothyroidism, unspecified: Secondary | ICD-10-CM

## 2014-12-05 DIAGNOSIS — Z79899 Other long term (current) drug therapy: Secondary | ICD-10-CM | POA: Diagnosis not present

## 2014-12-05 DIAGNOSIS — E782 Mixed hyperlipidemia: Secondary | ICD-10-CM

## 2014-12-05 DIAGNOSIS — R7303 Prediabetes: Secondary | ICD-10-CM

## 2014-12-05 DIAGNOSIS — R7309 Other abnormal glucose: Secondary | ICD-10-CM

## 2014-12-05 DIAGNOSIS — E559 Vitamin D deficiency, unspecified: Secondary | ICD-10-CM

## 2014-12-05 DIAGNOSIS — G308 Other Alzheimer's disease: Secondary | ICD-10-CM

## 2014-12-05 DIAGNOSIS — I10 Essential (primary) hypertension: Secondary | ICD-10-CM

## 2014-12-05 DIAGNOSIS — G301 Alzheimer's disease with late onset: Secondary | ICD-10-CM

## 2014-12-05 DIAGNOSIS — R0989 Other specified symptoms and signs involving the circulatory and respiratory systems: Secondary | ICD-10-CM

## 2014-12-05 DIAGNOSIS — F0280 Dementia in other diseases classified elsewhere without behavioral disturbance: Secondary | ICD-10-CM

## 2014-12-05 LAB — CBC WITH DIFFERENTIAL/PLATELET
BASOS ABS: 0 10*3/uL (ref 0.0–0.1)
Basophils Relative: 0 % (ref 0–1)
EOS PCT: 1 % (ref 0–5)
Eosinophils Absolute: 0.1 10*3/uL (ref 0.0–0.7)
HEMATOCRIT: 37.9 % (ref 36.0–46.0)
Hemoglobin: 13.2 g/dL (ref 12.0–15.0)
LYMPHS PCT: 19 % (ref 12–46)
Lymphs Abs: 1 10*3/uL (ref 0.7–4.0)
MCH: 33.6 pg (ref 26.0–34.0)
MCHC: 34.8 g/dL (ref 30.0–36.0)
MCV: 96.4 fL (ref 78.0–100.0)
MONO ABS: 0.4 10*3/uL (ref 0.1–1.0)
MPV: 9.5 fL (ref 8.6–12.4)
Monocytes Relative: 8 % (ref 3–12)
Neutro Abs: 3.6 10*3/uL (ref 1.7–7.7)
Neutrophils Relative %: 72 % (ref 43–77)
Platelets: 212 10*3/uL (ref 150–400)
RBC: 3.93 MIL/uL (ref 3.87–5.11)
RDW: 14.1 % (ref 11.5–15.5)
WBC: 5 10*3/uL (ref 4.0–10.5)

## 2014-12-05 LAB — HEPATIC FUNCTION PANEL
ALK PHOS: 63 U/L (ref 33–130)
ALT: 11 U/L (ref 6–29)
AST: 18 U/L (ref 10–35)
Albumin: 4 g/dL (ref 3.6–5.1)
Bilirubin, Direct: 0.1 mg/dL (ref <=0.2)
Indirect Bilirubin: 0.4 mg/dL (ref 0.2–1.2)
TOTAL PROTEIN: 6.3 g/dL (ref 6.1–8.1)
Total Bilirubin: 0.5 mg/dL (ref 0.2–1.2)

## 2014-12-05 LAB — LIPID PANEL
Cholesterol: 160 mg/dL (ref 125–200)
HDL: 56 mg/dL (ref >=46)
LDL CALC: 85 mg/dL (ref <130)
TRIGLYCERIDES: 96 mg/dL (ref <150)
Total CHOL/HDL Ratio: 2.9 Ratio (ref <=5.0)
VLDL: 19 mg/dL (ref <30)

## 2014-12-05 LAB — BASIC METABOLIC PANEL WITH GFR
BUN: 18 mg/dL (ref 7–25)
CO2: 29 mmol/L (ref 20–31)
Calcium: 9.1 mg/dL (ref 8.6–10.4)
Chloride: 103 mmol/L (ref 98–110)
Creat: 0.95 mg/dL — ABNORMAL HIGH (ref 0.60–0.93)
GFR, EST AFRICAN AMERICAN: 66 mL/min (ref >=60)
GFR, Est Non African American: 58 mL/min — ABNORMAL LOW (ref >=60)
Glucose, Bld: 85 mg/dL (ref 65–99)
POTASSIUM: 4.1 mmol/L (ref 3.5–5.3)
Sodium: 140 mmol/L (ref 135–146)

## 2014-12-05 LAB — HEMOGLOBIN A1C
HEMOGLOBIN A1C: 5.7 % — AB (ref <5.7)
Mean Plasma Glucose: 117 mg/dL — ABNORMAL HIGH (ref <117)

## 2014-12-05 LAB — MAGNESIUM: Magnesium: 2.1 mg/dL (ref 1.5–2.5)

## 2014-12-05 LAB — TSH: TSH: 2.82 u[IU]/mL (ref 0.350–4.500)

## 2014-12-05 NOTE — Progress Notes (Signed)
Patient ID: Amber Chan, female   DOB: 04-10-1935, 80 y.o.   MRN: [id number]  Assessment and Plan:  Hypertension:  -Continue medication,  -monitor blood pressure at home.  -Continue DASH diet.   -Reminder to go to the ER if any CP, SOB, nausea, dizziness, severe HA, changes vision/speech, left arm numbness and tingling, and jaw pain.  Cholesterol: -Continue diet and exercise.  -Check cholesterol.   Pre-diabetes: -Continue diet and exercise.  -Check A1C  Vitamin D Def: -check level -continue medications.   SDAT -discussed with husband case management to help if needed -offered Namenda which patient and her husband declined  Continue diet and meds as discussed. Further disposition pending results of labs.  HPI 80 y.o. female  presents for 3 month follow up with hypertension, hyperlipidemia, prediabetes and vitamin D.   Her blood pressure has been controlled at home, today their BP is BP: 122/60 mmHg.   She does not workout. She denies chest pain, shortness of breath, dizziness.  She has not been walking with her husband.  She does go out shopping and is up walking around the house but doesn't do anything formally.     She is not on cholesterol medication and denies myalgias. Her cholesterol is at goal. The cholesterol last visit was:   Lab Results  Component Value Date   CHOL 172 09/02/2014   HDL 55 09/02/2014   LDLCALC 98 09/02/2014   TRIG 94 09/02/2014   CHOLHDL 3.1 09/02/2014     She has been working on diet and exercise for prediabetes, and denies foot ulcerations, hyperglycemia, hypoglycemia , increased appetite, nausea, paresthesia of the feet, polydipsia, polyuria, visual disturbances, vomiting and weight loss. Last A1C in the office was:  Lab Results  Component Value Date   HGBA1C 5.6 09/02/2014    Patient is on Vitamin D supplement.  Lab Results  Component Value Date   VD25OH 61 09/02/2014        Current Medications:  Current Outpatient  Prescriptions on File Prior to Visit  Medication Sig Dispense Refill  . calcium carbonate (TUMS - DOSED IN MG ELEMENTAL CALCIUM) 500 MG chewable tablet Chew 2 tablets by mouth 2 (two) times daily.    . Cholecalciferol (VITAMIN D PO) Take 2,000 Units by mouth 2 (two) times daily.     Marland Kitchen donepezil (ARICEPT) 10 MG tablet TAKE 1 TABLET EVERY DAY FOR MEMORY. 90 tablet 3  . levothyroxine (SYNTHROID, LEVOTHROID) 50 MCG tablet TAKE 1 TABLET EVERY DAY FOR THYROID 90 tablet 3   No current facility-administered medications on file prior to visit.    Medical History:  Past Medical History  Diagnosis Date  . Labile hypertension   . Hypothyroid   . IBS (irritable bowel syndrome)   . Vitamin D deficiency   . Breast cancer 1970    Right  . Dementia     moderate  . Atrial flutter     Allergies: No Known Allergies   Review of Systems:  Review of Systems  Constitutional: Negative for fever, chills and malaise/fatigue.  HENT: Negative for ear pain.   Skin: Negative.   Neurological: Negative for headaches.    Family history- Review and unchanged  Social history- Review and unchanged  Physical Exam: BP 122/60 mmHg  Pulse 56  Temp(Src) 97.8 F (36.6 C) (Temporal)  Resp 16  Ht 5\' 8"  (1.727 m)  Wt 117 lb (53.071 kg)  BMI 17.79 kg/m2 Wt Readings from Last 3 Encounters:  12/05/14 117 lb (53.071 kg)  09/02/14 118 lb 6.4 oz (53.706 kg)  05/23/14 124 lb (56.246 kg)    General Appearance: Well nourished well developed, in no apparent distress. Eyes: PERRLA, EOMs, conjunctiva no swelling or erythema ENT/Mouth: Ear canals normal without obstruction, swelling, erythma, discharge.  TMs normal bilaterally.  Oropharynx moist, clear, without exudate, or postoropharyngeal swelling. Neck: Supple, thyroid normal,no cervical adenopathy  Respiratory: Respiratory effort normal, Breath sounds clear A&P without rhonchi, wheeze, or rale.  No retractions, no accessory usage. Cardio: RRR with no MRGs. Brisk  peripheral pulses without edema. Status post mastectomy Abdomen: Soft, + BS,  Non tender, no guarding, rebound, hernias, masses. Musculoskeletal: Full ROM, 5/5 strength, Normal gait Skin: Warm, dry without rashes, lesions, ecchymosis.  Neuro: Awake and oriented X 3, Cranial nerves intact. Normal muscle tone, no cerebellar symptoms. Psych: Normal affect, Poor Insight and Judgment, poor short term memory  Medical laboratory scientific officer, PA-C 10:59 AM [facility]

## 2014-12-06 LAB — VITAMIN D 25 HYDROXY (VIT D DEFICIENCY, FRACTURES): Vit D, 25-Hydroxy: 55 ng/mL (ref 30–100)

## 2014-12-06 LAB — INSULIN, RANDOM: INSULIN: 2.1 u[IU]/mL (ref 2.0–19.6)

## 2015-02-24 DIAGNOSIS — H2513 Age-related nuclear cataract, bilateral: Secondary | ICD-10-CM | POA: Diagnosis not present

## 2015-03-19 ENCOUNTER — Encounter: Payer: Self-pay | Admitting: Internal Medicine

## 2015-03-19 ENCOUNTER — Ambulatory Visit (INDEPENDENT_AMBULATORY_CARE_PROVIDER_SITE_OTHER): Payer: Medicare Other | Admitting: Internal Medicine

## 2015-03-19 VITALS — BP 122/78 | HR 60 | Temp 97.7°F | Resp 16 | Ht 69.0 in | Wt 119.6 lb

## 2015-03-19 DIAGNOSIS — R296 Repeated falls: Secondary | ICD-10-CM | POA: Diagnosis not present

## 2015-03-19 DIAGNOSIS — R7309 Other abnormal glucose: Secondary | ICD-10-CM

## 2015-03-19 DIAGNOSIS — E039 Hypothyroidism, unspecified: Secondary | ICD-10-CM

## 2015-03-19 DIAGNOSIS — E559 Vitamin D deficiency, unspecified: Secondary | ICD-10-CM

## 2015-03-19 DIAGNOSIS — G301 Alzheimer's disease with late onset: Secondary | ICD-10-CM

## 2015-03-19 DIAGNOSIS — I10 Essential (primary) hypertension: Secondary | ICD-10-CM

## 2015-03-19 DIAGNOSIS — R7303 Prediabetes: Secondary | ICD-10-CM | POA: Diagnosis not present

## 2015-03-19 DIAGNOSIS — Z1331 Encounter for screening for depression: Secondary | ICD-10-CM

## 2015-03-19 DIAGNOSIS — F0280 Dementia in other diseases classified elsewhere without behavioral disturbance: Secondary | ICD-10-CM

## 2015-03-19 DIAGNOSIS — Z79899 Other long term (current) drug therapy: Secondary | ICD-10-CM

## 2015-03-19 DIAGNOSIS — Z23 Encounter for immunization: Secondary | ICD-10-CM

## 2015-03-19 DIAGNOSIS — Z1389 Encounter for screening for other disorder: Secondary | ICD-10-CM | POA: Diagnosis not present

## 2015-03-19 DIAGNOSIS — G308 Other Alzheimer's disease: Secondary | ICD-10-CM | POA: Diagnosis not present

## 2015-03-19 DIAGNOSIS — E782 Mixed hyperlipidemia: Secondary | ICD-10-CM

## 2015-03-19 DIAGNOSIS — R0989 Other specified symptoms and signs involving the circulatory and respiratory systems: Secondary | ICD-10-CM

## 2015-03-19 DIAGNOSIS — Z681 Body mass index (BMI) 19 or less, adult: Secondary | ICD-10-CM | POA: Diagnosis not present

## 2015-03-19 DIAGNOSIS — Z9181 History of falling: Secondary | ICD-10-CM

## 2015-03-19 LAB — CBC WITH DIFFERENTIAL/PLATELET
Basophils Absolute: 0 10*3/uL (ref 0.0–0.1)
Basophils Relative: 0 % (ref 0–1)
Eosinophils Absolute: 0.1 10*3/uL (ref 0.0–0.7)
Eosinophils Relative: 1 % (ref 0–5)
HEMATOCRIT: 37.6 % (ref 36.0–46.0)
HEMOGLOBIN: 13.6 g/dL (ref 12.0–15.0)
LYMPHS ABS: 1.1 10*3/uL (ref 0.7–4.0)
LYMPHS PCT: 20 % (ref 12–46)
MCH: 34.8 pg — ABNORMAL HIGH (ref 26.0–34.0)
MCHC: 36.2 g/dL — AB (ref 30.0–36.0)
MCV: 96.2 fL (ref 78.0–100.0)
MONO ABS: 0.6 10*3/uL (ref 0.1–1.0)
MONOS PCT: 12 % (ref 3–12)
MPV: 9.5 fL (ref 8.6–12.4)
NEUTROS ABS: 3.6 10*3/uL (ref 1.7–7.7)
Neutrophils Relative %: 67 % (ref 43–77)
Platelets: 239 10*3/uL (ref 150–400)
RBC: 3.91 MIL/uL (ref 3.87–5.11)
RDW: 14.6 % (ref 11.5–15.5)
WBC: 5.3 10*3/uL (ref 4.0–10.5)

## 2015-03-19 LAB — LIPID PANEL
CHOLESTEROL: 182 mg/dL (ref 125–200)
HDL: 64 mg/dL (ref >=46)
LDL CALC: 99 mg/dL (ref <130)
Total CHOL/HDL Ratio: 2.8 Ratio (ref <=5.0)
Triglycerides: 94 mg/dL (ref <150)
VLDL: 19 mg/dL (ref <30)

## 2015-03-19 LAB — MAGNESIUM: Magnesium: 2 mg/dL (ref 1.5–2.5)

## 2015-03-19 LAB — HEPATIC FUNCTION PANEL
ALK PHOS: 71 U/L (ref 33–130)
ALT: 12 U/L (ref 6–29)
AST: 20 U/L (ref 10–35)
Albumin: 3.8 g/dL (ref 3.6–5.1)
Bilirubin, Direct: 0.1 mg/dL (ref <=0.2)
Indirect Bilirubin: 0.5 mg/dL (ref 0.2–1.2)
TOTAL PROTEIN: 6.6 g/dL (ref 6.1–8.1)
Total Bilirubin: 0.6 mg/dL (ref 0.2–1.2)

## 2015-03-19 LAB — BASIC METABOLIC PANEL WITH GFR
BUN: 21 mg/dL (ref 7–25)
CALCIUM: 9 mg/dL (ref 8.6–10.4)
CO2: 26 mmol/L (ref 20–31)
Chloride: 103 mmol/L (ref 98–110)
Creat: 0.93 mg/dL (ref 0.60–0.93)
GFR, EST NON AFRICAN AMERICAN: 59 mL/min — AB (ref >=60)
GFR, Est African American: 68 mL/min (ref >=60)
GLUCOSE: 68 mg/dL (ref 65–99)
Potassium: 4.3 mmol/L (ref 3.5–5.3)
Sodium: 141 mmol/L (ref 135–146)

## 2015-03-19 LAB — TSH: TSH: 3.661 u[IU]/mL (ref 0.350–4.500)

## 2015-03-19 LAB — HEMOGLOBIN A1C
HEMOGLOBIN A1C: 5.5 % (ref <5.7)
MEAN PLASMA GLUCOSE: 111 mg/dL (ref <117)

## 2015-03-19 NOTE — Progress Notes (Addendum)
Patient ID: Amber Chan, female   DOB: 12/24/1934, 80 y.o.   MRN: [id number]     This very nice 80 y.o. MWF presents for 6 month follow up with Hypertension, Hyperlipidemia, Pre-Diabetes and Vitamin D Deficiency. Patient has mild to moderate SDAT and requires close monitoring and supervision by the spouse.      Patient is monitored expectantly for Labile HTN & BP has been controlled and today's BP: 122/78 mmHg. Patient has had no complaints of any cardiac type chest pain, palpitations, dyspnea/orthopnea/PND, dizziness, claudication, or dependent edema.     Hyperlipidemia is controlled with diet. Current Lipids are at goal with Cholesterol 182; HDL 64; LDL 99; Triglycerides 94.     Also, the patient is also monitored expectantly for PreDiabetes and has had no symptoms of reactive hypoglycemia, diabetic polys, paresthesias or visual blurring.  Current A1c is at goal at A1c of 5.5%.        Patient has been on Thyroid replacement since 2010. Further, the patient also has history of Vitamin D Deficiency of 22 in 2008 and supplements vitamin D without any suspected side-effects. Last vitamin D was  55 on 12/05/2014.     Medication Sig  . aspirin 81 MG tablet Take 81 mg by mouth daily.  . TUMS  500 MG  Chew 2 tablets by mouth 2 (two) times daily.  Marland Kitchen VITAMIN D  Take 2,000 Units by mouth 2 (two) times daily.   Marland Kitchen donepezil (ARICEPT) 10 MG tablet TAKE 1 TABLET EVERY DAY FOR MEMORY.  Marland Kitchen levothyroxine  50 MCG tablet TAKE 1 TABLET EVERY DAY FOR THYROID   No Known Allergies  PMHx:   Past Medical History  Diagnosis Date  . Labile hypertension   . Hypothyroid   . IBS (irritable bowel syndrome)   . Vitamin D deficiency   . Breast cancer ([place]) 1970    Right  . Dementia     moderate  . Atrial flutter ([place])    Immunization History  Administered Date(s) Administered  . Influenza Split 02/13/2013  . Influenza, High Dose Seasonal PF 02/26/2014, 03/19/2015  . Influenza-Unspecified 01/17/2013  .  Pneumococcal Conjugate-13 02/18/2014  . Pneumococcal Polysaccharide-23 09/02/2008  . Td 04/20/2006   Past Surgical History  Procedure Laterality Date  . Mastectomy      right 1978 left 1980  . Cholecystectomy      1988  . Biopsy thyroid      1999  . Ablation  08-28-2013    RFCA ablation of atrial flutter by Dr Lovena Le  . Atrial flutter ablation N/A 08/28/2013    Procedure: ATRIAL FLUTTER ABLATION;  Surgeon: Evans Lance, MD;  Location: [facility] CATH LAB;  Service: Cardiovascular;  Laterality: N/A;   FHx:    Reviewed / unchanged  SHx:    Reviewed / unchanged  Systems Review:  Constitutional: Denies fever, chills, wt changes, headaches, insomnia, fatigue, night sweats, change in appetite. Eyes: Denies redness, blurred vision, diplopia, discharge, itchy, watery eyes.  ENT: Denies discharge, congestion, post nasal drip, epistaxis, sore throat, earache, hearing loss, dental pain, tinnitus, vertigo, sinus pain, snoring.  CV: Denies chest pain, palpitations, irregular heartbeat, syncope, dyspnea, diaphoresis, orthopnea, PND, claudication or edema. Respiratory: denies cough, dyspnea, DOE, pleurisy, hoarseness, laryngitis, wheezing.  Gastrointestinal: Denies dysphagia, odynophagia, heartburn, reflux, water brash, abdominal pain or cramps, nausea, vomiting, bloating, diarrhea, constipation, hematemesis, melena, hematochezia  or hemorrhoids. Genitourinary: Denies dysuria, frequency, urgency, nocturia, hesitancy, discharge, hematuria or flank pain. Musculoskeletal: Denies arthralgias, myalgias, stiffness, jt. swelling,  pain, limping or strain/sprain.  Skin: Denies pruritus, rash, hives, warts, acne, eczema or change in skin lesion(s). Neuro: No weakness, tremor, incoordination, spasms, paresthesia or pain. Psychiatric: Denies confusion, memory loss or sensory loss. Endo: Denies change in weight, skin or hair change.  Heme/Lymph: No excessive bleeding, bruising or enlarged lymph nodes.  Physical  Exam  BP 122/78 mmHg  Pulse 60  Temp(Src) 97.7 F (36.5 C)  Resp 16  Ht 5\' 9"  (1.753 m)  Wt 119 lb 9.6 oz (54.25 kg)  BMI 17.65 kg/m2  Appears well nourished and in no distress. Eyes: PERRLA, EOMs, conjunctiva no swelling or erythema. Sinuses: No frontal/maxillary tenderness ENT/Mouth: EAC's clear, TM's nl w/o erythema, bulging. Nares clear w/o erythema, swelling, exudates. Oropharynx clear without erythema or exudates. Oral hygiene is good. Tongue normal, non obstructing. Hearing intact.  Neck: Supple. Thyroid nl. Car 2+/2+ without bruits, nodes or JVD. Chest: Respirations nl with BS clear & equal w/o rales, rhonchi, wheezing or stridor.  Cor: Heart sounds normal w/ regular rate and rhythm without sig. murmurs, gallops, clicks, or rubs. Peripheral pulses normal and equal  without edema.  Abdomen: Soft & bowel sounds normal. Non-tender w/o guarding, rebound, hernias, masses, or organomegaly.  Lymphatics: Unremarkable.  Musculoskeletal: Generalized decrease in muscle power, tone & bulk. Broad based gait . No tremor.  Skin: Warm, dry without exposed rashes, lesions or ecchymosis apparent.  Neuro: Cranial nerves intact, reflexes equal bilaterally. Sensory-motor testing grossly intact. (+) Snout & palmomental reflex.  Tendon reflexes grossly intact.  Pysch: Alert & oriented x 3.  Insight and judgement limited  with concrete abstractions. No ideations.  Assessment and Plan:  1. Labile hypertension  - TSH  2. Hyperlipidemia  - Lipid panel  3. Prediabetes  - Hemoglobin A1c - Insulin, random  4. Vitamin D deficiency  - VITAMIN D 25 Hydroxy   5. SDAT (senile dementia of Alzheimer's type)   6. Other abnormal glucose  - Hemoglobin A1c - Insulin, random  7. Need for prophylactic vaccination and inoculation against influenza  - Flu vaccine HIGH DOSE PF (Fluzone High dose)  8. Hypothyroidism   9. Medication management  - CBC with Differential/Platelet - BASIC METABOLIC  PANEL WITH GFR - Hepatic function panel - Magnesium  10. BMI less than 19,adult   11. Depression screen   12. At moderate risk for fall   Recommended regular exercise, BP monitoring, weight control, and discussed med and SE's. Recommended labs to assess and monitor clinical status. Further disposition pending results of labs. Over 30 minutes of exam, counseling, chart review was performed

## 2015-03-19 NOTE — Patient Instructions (Signed)

## 2015-03-20 LAB — INSULIN, RANDOM: Insulin: 2.2 u[IU]/mL (ref 2.0–19.6)

## 2015-03-20 LAB — VITAMIN D 25 HYDROXY (VIT D DEFICIENCY, FRACTURES): Vit D, 25-Hydroxy: 65 ng/mL (ref 30–100)

## 2015-05-12 ENCOUNTER — Other Ambulatory Visit: Payer: Self-pay | Admitting: *Deleted

## 2015-05-12 MED ORDER — LEVOTHYROXINE SODIUM 50 MCG PO TABS: ORAL_TABLET | ORAL | Status: DC

## 2015-05-12 MED ORDER — DONEPEZIL HCL 10 MG PO TABS: ORAL_TABLET | ORAL | Status: DC

## 2015-06-20 ENCOUNTER — Ambulatory Visit (INDEPENDENT_AMBULATORY_CARE_PROVIDER_SITE_OTHER): Payer: Medicare Other | Admitting: Internal Medicine

## 2015-06-20 ENCOUNTER — Encounter: Payer: Self-pay | Admitting: Internal Medicine

## 2015-06-20 VITALS — BP 110/64 | HR 68 | Temp 98.0°F | Resp 16 | Ht 68.0 in | Wt 117.0 lb

## 2015-06-20 DIAGNOSIS — I313 Pericardial effusion (noninflammatory): Secondary | ICD-10-CM

## 2015-06-20 DIAGNOSIS — I4892 Unspecified atrial flutter: Secondary | ICD-10-CM | POA: Diagnosis not present

## 2015-06-20 DIAGNOSIS — I319 Disease of pericardium, unspecified: Secondary | ICD-10-CM

## 2015-06-20 DIAGNOSIS — Z681 Body mass index (BMI) 19 or less, adult: Secondary | ICD-10-CM | POA: Diagnosis not present

## 2015-06-20 DIAGNOSIS — R7303 Prediabetes: Secondary | ICD-10-CM

## 2015-06-20 DIAGNOSIS — Z0000 Encounter for general adult medical examination without abnormal findings: Secondary | ICD-10-CM

## 2015-06-20 DIAGNOSIS — R6889 Other general symptoms and signs: Secondary | ICD-10-CM | POA: Diagnosis not present

## 2015-06-20 DIAGNOSIS — I10 Essential (primary) hypertension: Secondary | ICD-10-CM

## 2015-06-20 DIAGNOSIS — Z0001 Encounter for general adult medical examination with abnormal findings: Secondary | ICD-10-CM | POA: Diagnosis not present

## 2015-06-20 DIAGNOSIS — Z79899 Other long term (current) drug therapy: Secondary | ICD-10-CM | POA: Diagnosis not present

## 2015-06-20 DIAGNOSIS — I3139 Other pericardial effusion (noninflammatory): Secondary | ICD-10-CM

## 2015-06-20 DIAGNOSIS — E039 Hypothyroidism, unspecified: Secondary | ICD-10-CM

## 2015-06-20 DIAGNOSIS — E782 Mixed hyperlipidemia: Secondary | ICD-10-CM | POA: Diagnosis not present

## 2015-06-20 DIAGNOSIS — G308 Other Alzheimer's disease: Secondary | ICD-10-CM | POA: Diagnosis not present

## 2015-06-20 DIAGNOSIS — E559 Vitamin D deficiency, unspecified: Secondary | ICD-10-CM | POA: Diagnosis not present

## 2015-06-20 DIAGNOSIS — G301 Alzheimer's disease with late onset: Secondary | ICD-10-CM

## 2015-06-20 DIAGNOSIS — C50911 Malignant neoplasm of unspecified site of right female breast: Secondary | ICD-10-CM | POA: Diagnosis not present

## 2015-06-20 DIAGNOSIS — K589 Irritable bowel syndrome without diarrhea: Secondary | ICD-10-CM

## 2015-06-20 DIAGNOSIS — F0280 Dementia in other diseases classified elsewhere without behavioral disturbance: Secondary | ICD-10-CM

## 2015-06-20 DIAGNOSIS — R0989 Other specified symptoms and signs involving the circulatory and respiratory systems: Secondary | ICD-10-CM

## 2015-06-20 NOTE — Progress Notes (Signed)
MEDICARE ANNUAL WELLNESS VISIT AND FOLLOW UP  Assessment:    1. Labile hypertension -well controlled with diet and exercise -dash diet -monitor at home  2. Atrial flutter, unspecified type ([place]) -currently regular rhythm  3. Pericardial effusion -resolved  4. IBS (irritable bowel syndrome) -currently without flares  5. Hypothyroidism, unspecified hypothyroidism type -cont levothyroxine  6. SDAT (senile dementia of Alzheimer's type) -dc aricept as likely contributing to difficulty with bowel and bladder -not working either  7. Malignant neoplasm of right female breast, unspecified site of breast ([place]) -status post bilateral mastectomy  8. Vitamin D deficiency -cont supplement  9. Prediabetes -cont diet and exercise  10. Hyperlipidemia -well controlled with diet  11. Medication management -labs at next visit  12. BMI less than 19,adult -use boost or ensure -slowly increase food intake    Over 30 minutes of exam, counseling, chart review, and critical decision making was performed  Plan:   During the course of the visit the patient was educated and counseled about appropriate screening and preventive services including:    Pneumococcal vaccine   Influenza vaccine  Td vaccine  Prevnar 13  Screening electrocardiogram  Screening mammography  Bone densitometry screening  Colorectal cancer screening  Diabetes screening  Glaucoma screening  Nutrition counseling   Advanced directives: given info/requested copies  Conditions/risks identified: Diabetes is at goal, ACE/ARB therapy: No, Reason not on Ace Inhibitor/ARB therapy:  not indicated Urinary Incontinence is an issue: discussed non pharmacology and pharmacology options.  Fall risk: low- discussed PT, home fall assessment, medications.    Subjective:   Amber Chan is a 81 y.o. female who presents for Medicare Annual Wellness Visit and 3 month follow up on hypertension,  prediabetes, hyperlipidemia, vitamin D def.  Date of last medicare wellness visit is unknown.   Her blood pressure has been controlled at home, today their BP is BP: 110/64 mmHg She does workout. She denies chest pain, shortness of breath, dizziness.  She is not on cholesterol medication and denies myalgias. Her cholesterol is at goal. The cholesterol last visit was:   Lab Results  Component Value Date   CHOL 182 03/19/2015   HDL 64 03/19/2015   LDLCALC 99 03/19/2015   TRIG 94 03/19/2015   CHOLHDL 2.8 03/19/2015   She has been working on diet and exercise for prediabetes, and denies foot ulcerations, hyperglycemia, hypoglycemia , increased appetite, nausea, paresthesia of the feet, polydipsia, polyuria, visual disturbances, vomiting and weight loss. Last A1C in the office was:  Lab Results  Component Value Date   HGBA1C 5.5 03/19/2015   Last GFR NonAA   Lab Results  Component Value Date   GFRNONAA 59* 03/19/2015   AA  Lab Results  Component Value Date   GFRAA 68 03/19/2015   Patient is on Vitamin D supplement. Lab Results  Component Value Date   VD25OH 65 03/19/2015      Medication Review Current Outpatient Prescriptions on File Prior to Visit  Medication Sig Dispense Refill  . aspirin 81 MG tablet Take 81 mg by mouth daily.    . calcium carbonate (TUMS - DOSED IN MG ELEMENTAL CALCIUM) 500 MG chewable tablet Chew 2 tablets by mouth 2 (two) times daily.    . Cholecalciferol (VITAMIN D PO) Take 2,000 Units by mouth 2 (two) times daily.     Marland Kitchen donepezil (ARICEPT) 10 MG tablet TAKE 1 TABLET EVERY DAY FOR MEMORY. 90 tablet 3  . levothyroxine (SYNTHROID, LEVOTHROID) 50 MCG tablet TAKE 1 TABLET EVERY  DAY FOR THYROID 90 tablet 3   No current facility-administered medications on file prior to visit.    Current Problems (verified) Patient Active Problem List   Diagnosis Date Noted  . BMI less than 19,adult 03/19/2015  . Atrial flutter ([place]) 09/27/2013  . Pericardial effusion  09/27/2013  . Vitamin D deficiency 08/13/2013  . Prediabetes 08/13/2013  . Hyperlipidemia 08/13/2013  . Medication management 08/13/2013  . SDAT (senile dementia of Alzheimer's type) 08/13/2013  . Labile hypertension   . Hypothyroid   . IBS (irritable bowel syndrome)   . Breast cancer ([place])     Screening Tests Immunization History  Administered Date(s) Administered  . Influenza Split 02/13/2013  . Influenza, High Dose Seasonal PF 02/26/2014, 03/19/2015  . Influenza-Unspecified 01/17/2013  . Pneumococcal Conjugate-13 02/18/2014  . Pneumococcal Polysaccharide-23 09/02/2008  . Td 04/20/2006    Preventative care: Last colonoscopy: 2003 Last mammogram: Mastectomy   DEXA:declined, known osteoporosis  Prior vaccinations: TD or Tdap: 2008  Influenza: 2016  Pneumococcal: 2010 Prevnar13: 2015 Shingles/Zostavax: Will call insurance  Names of Other Physician/Practitioners you currently use: 1. [facility]- here for primary care 2. Dr. Gershon Crane, eye doctor, last visit 2016 3. Dr. Orvil Feil, dentist, last visit 2017 Patient Care Team: Unk Pinto, MD as PCP - General (Internal Medicine) Inda Castle, MD as Consulting Physician (Gastroenterology) Rutherford Guys, MD as Consulting Physician (Ophthalmology) Evans Lance, MD as Consulting Physician (Cardiology) Derrek Gu (Dentistry)  Past Surgical History  Procedure Laterality Date  . Mastectomy      right 1978 left 1980  . Cholecystectomy      1988  . Biopsy thyroid      1999  . Ablation  08-28-2013    RFCA ablation of atrial flutter by Dr Lovena Le  . Atrial flutter ablation N/A 08/28/2013    Procedure: ATRIAL FLUTTER ABLATION;  Surgeon: Evans Lance, MD;  Location[facility] CATH LAB;  Service: Cardiovascular;  Laterality: N/A;   Family History  Problem Relation Age of Onset  . Hypertension Mother   . Cancer Mother     colon  . Stroke Father   . Heart disease Father   . Diabetes  Brother    Social History  Substance Use Topics  . Smoking status: Never Smoker   . Smokeless tobacco: None  . Alcohol Use: No    MEDICARE WELLNESS OBJECTIVES: Tobacco use: She does not smoke.  Patient is not a former smoker. If yes, counseling given Alcohol Current alcohol use: none Osteoporosis: postmenopausal estrogen deficiency, History of fracture in the past year: yes Fall risk: High Risk Hearing: normal Visual acuity: normal,  does not perform annual eye exam Diet: well balanced Physical activity:   Cardiac risk factors:   Depression/mood screen:   Depression screen PHQ 2/9 03/19/2015  Decreased Interest 0  Down, Depressed, Hopeless 0  PHQ - 2 Score 0    ADLs:  In your present state of health, do you have any difficulty performing the following activities: 03/19/2015 09/01/2014  Hearing? Y N  Vision? N N  Difficulty concentrating or making decisions? Tempie Donning  Walking or climbing stairs? Y N  Dressing or bathing? Y N  Doing errands, shopping? Tempie Donning     Cognitive Testing  Alert? Yes  Normal Appearance?Yes  Oriented to person? Yes  Place? Yes   Time? Yes  Recall of three objects?  Yes  Can perform simple calculations? Yes  Displays appropriate judgment?Yes  Can read the correct time from  a watch face?Yes  EOL planning:     Objective:   Today's Vitals   06/20/15 1107  BP: 110/64  Pulse: 68  Temp: 98 F (36.7 C)  TempSrc: Temporal  Resp: 16  Height: 5\' 8"  (1.727 m)  Weight: 117 lb (53.071 kg)   Body mass index is 17.79 kg/(m^2).  General appearance: alert, no distress, WD/WN,  female HEENT: normocephalic, sclerae anicteric, TMs pearly, nares patent, no discharge or erythema, pharynx normal Oral cavity: MMM, no lesions Neck: supple, no lymphadenopathy, no thyromegaly, no masses Heart: RRR, normal S1, S2, no murmurs Lungs: CTA bilaterally, no wheezes, rhonchi, or rales Abdomen: +bs, soft, non tender, non distended, no masses, no hepatomegaly, no  splenomegaly Musculoskeletal: nontender, no swelling, no obvious deformity Extremities: no edema, no cyanosis, no clubbing Pulses: 2+ symmetric, upper and lower extremities, normal cap refill Neurological: alert, oriented x 3, CN2-12 intact, strength normal upper extremities and lower extremities, sensation normal throughout, DTRs 2+ throughout, no cerebellar signs, gait normal Psychiatric: flat affect, expressive aphasia, difficulty with short term memory and answering simple questions. Flat affect  Breast: defer Gyn: defer Rectal: defer   Medicare Attestation I have personally reviewed: The patient's medical and social history Their use of alcohol, tobacco or illicit drugs Their current medications and supplements The patient's functional ability including ADLs,fall risks, home safety risks, cognitive, and hearing and visual impairment Diet and physical activities Evidence for depression or mood disorders  The patient's weight, height, BMI, and visual acuity have been recorded in the chart.  I have made referrals, counseling, and provided education to the patient based on review of the above and I have provided the patient with a written personalized care plan for preventive services.     Starlyn Skeans, PA-C   06/20/2015

## 2015-07-25 ENCOUNTER — Telehealth: Payer: Self-pay | Admitting: Internal Medicine

## 2015-07-25 ENCOUNTER — Other Ambulatory Visit: Payer: Self-pay | Admitting: *Deleted

## 2015-07-25 MED ORDER — QUETIAPINE FUMARATE 50 MG PO TABS: 50.0000 mg | ORAL_TABLET | Freq: Every day | ORAL | Status: DC

## 2015-07-25 NOTE — Telephone Encounter (Signed)
Patient's husband called with the complaint that the patient is not sleeping well since stopping the aricept and has been pacing around and agitated throughout the night.  They are requesting a medication to try.

## 2015-08-22 ENCOUNTER — Ambulatory Visit: Payer: Self-pay | Admitting: Internal Medicine

## 2015-09-16 ENCOUNTER — Encounter: Payer: Self-pay | Admitting: Internal Medicine

## 2015-11-02 NOTE — Progress Notes (Signed)
Patient ID: Amber Chan, female   DOB: [birth year], 81 y.o.   MRN: [id number]  [facility]                   Unk Pinto, M.D.    Uvaldo Bristle. Silverio Lay, P.A.-C      Starlyn Skeans, P.A.-C   [facility]                [address] [place], [place] [id number] Telephone [phone] Telefax [phone]  ______________________________________________________________________  Comprehensive Evaluation &  Examination     This very nice 81 y.o. MWF presents for a comprehensive evaluation and management of multiple medical co-morbidities.  Patient has been followed for labile HTN, Prediabetes, Hyperlipidemia and Vitamin D Deficiency. Patient also has hx/o SDAT and per caretaker spouse, she is totally dependent in all aspects of personal  Care requiring 24 supervision. He elected to d/c her Donepezil for perceived benefit over the last 3-4 years      Patient has hx/o labile HTN & is followed expectantly.  Patient's BP has been controlled at home and patient denies any cardiac symptoms as chest pain, palpitations, shortness of breath, dizziness or ankle swelling. Today's BP: 112/60 mmHg      Patient's hyperlipidemia is controlled with diet. Last lipids were at goal with Cholesterol 182; HDL 64; LDL 99; Triglycerides 94 on 03/19/2015.     Patient is screened proactively for prediabetes and patient has had no reported  reactive hypoglycemic symptoms, visual blurring, diabetic polys, or paresthesias. Last A1c was 5.5% on 03/19/2015.     Finally, patient has history of Vitamin D Deficiency of "22" in 2008 and last Vitamin D was 65 on 03/19/2015.  Medication Sig  . aspirin 81 MG tablet Take 81 mg by mouth daily.  . TUMS 500 MG chewable  Chew 2 tablets by mouth 2 (two) times daily.  . Levothyroxine  50 MCG tablet TAKE 1 TABLET EVERY DAY FOR THYROID   No Known Allergies  Past Medical History  Diagnosis Date  .  Labile hypertension   . Hypothyroid   . IBS (irritable bowel syndrome)   . Vitamin D deficiency   . Breast cancer ([place]) 1970    Right  . Dementia     moderate  . Atrial flutter [facility])    Health Maintenance  Topic Date Due  . DEXA SCAN  04/20/2023 (Originally 11/23/2001)  . ZOSTAVAX  04/20/2023 (Originally 11/23/1996)  . INFLUENZA VACCINE  11/18/2015  . TETANUS/TDAP  04/20/2016  . PNA vac Low Risk Adult  Completed   Immunization History  Administered Date(s) Administered  . Influenza Split 02/13/2013  . Influenza, High Dose Seasonal PF 02/26/2014, 03/19/2015  . Influenza-Unspecified 01/17/2013  . Pneumococcal Conjugate-13 02/18/2014  . Pneumococcal Polysaccharide-23 09/02/2008  . Td 04/20/2006   Past Surgical History  Procedure Laterality Date  . Mastectomy  right 1978 left 1980  . Cholecystectomy  1988  . Biopsy thyroid  1999  . RFCA ablation of atrial flutter by Dr Lovena Le  08-28-2013   Family History  Problem Relation Age of Onset  . Hypertension Mother   . Cancer Mother     colon  . Stroke Father   . Heart disease Father   . Diabetes Brother    Social History  Substance Use Topics  . Smoking status: Never Smoker   . Smokeless tobacco: Not  on file  . Alcohol Use: No    ROS Patient is severely Demented and per caretaker spouse and reports 12 point system is totally negative.  Physical Exam  BP 112/60   Pulse 68  Temp 97.3 F   Resp 16  Ht 5\' 9"    Wt 117 lb 6.4 oz     BMI 17.33  General Appearance: Thin elderly female and in no apparent distress. Eyes: PERRLA, EOMs, conjunctiva no swelling or erythema, normal fundi and vessels. Sinuses: No frontal/maxillary tenderness ENT/Mouth: EACs patent / TMs  nl. Nares clear without erythema, swelling, mucoid exudates. Oral hygiene is good. No erythema, swelling, or exudate. Tongue normal, non-obstructing. Tonsils not swollen or erythematous. Hearing normal.  Neck: Supple, thyroid normal. No bruits, nodes or  JVD. Respiratory: Respiratory effort normal.  BS equal and clear bilateral without rales, rhonci, wheezing or stridor. Cardio: Heart sounds are normal with regular rate and rhythm and no murmurs, rubs or gallops. Peripheral pulses are normal and equal bilaterally without edema. No aortic or femoral bruits. Chest: symmetric with normal excursions and percussion. Breasts: surgically absent bilaterally w/o signs of any local reoccurances.  Abdomen: Flat, soft with bowel sounds active. Nontender, no guarding, rebound, hernias, masses, or organomegaly.  Lymphatics: Non tender without lymphadenopathy.  Musculoskeletal: Full ROM all peripheral extremities, joint stability, 5/5 strength, and broad-based gait. Skin: Warm and dry without rashes, lesions, cyanosis, clubbing or  ecchymosis.  Neuro: Cranial nerves intact, reflexes equal bilaterally. Decreased muscle power, tone & bulk , no cerebellar symptoms. Sensation intact. (+) Snout & palmomental response.      Pysch: Alert and oriented x 0, flat affect, Insight and Judgment severe limited . Cannot comprehend even simple questions or follow any simple commands . MMSE = 0.  Assessment and Plan  1. Labile hypertension  - Continue medication, monitor blood pressure at home. Continue DASH diet. Reminder to go to the ER if any CP, SOB, nausea, dizziness, severe HA, changes vision/speech, left arm numbness and tingling and jaw pain. - Microalbumin / creatinine urine ratio - EKG 12-Lead - TSH  2. Hyperlipidemia  - Continue diet/meds, exercise,& lifestyle modifications. Continue monitor periodic cholesterol/liver & renal functions  - Lipid panel - TSH  3. Prediabetes  - Continue supplementation. - Continue diet, exercise, lifestyle modifications. Monitor appropriate labs. - Hemoglobin A1c - Insulin, random  4. Vitamin D deficiency  - VITAMIN D 25 Hydroxy   5. Hypothyroidism   6. IBS (irritable bowel syndrome)   7. Screening for rectal  cancer  - POC Hemoccult Bld/Stl   8. Screening for AAA (aortic abdominal aneurysm)   9. Screening for ischemic heart disease  - EKG 12-Lead  10 . SDAT   11. Medication management  - Urinalysis, Routine w reflex microscopic  - CBC with Differential/Platelet - BASIC METABOLIC PANEL WITH GFR - Hepatic function panel - Magnesium   Continue prudent diet as discussed, weight control, BP monitoring, regular exercise, and medications. Discussed med's effects and SE's. Screening labs and tests as requested with regular follow-up as recommended. Over 40 minutes of exam, counseling, chart review and high complex critical decision making was performed.

## 2015-11-02 NOTE — Patient Instructions (Signed)
Recommend Adult Low Dose Aspirin or   coated  Aspirin 81 mg daily   To reduce risk of Colon Cancer 20 %,   Skin Cancer 26 % ,   Melanoma 46%   and   Pancreatic cancer 60%   ++++++++++++++++++++++++++++++++++++++++++++++++++++++ Vitamin D goal   is between 70-100.   Please make sure that you are taking your Vitamin D as directed.   It is very important as a natural anti-inflammatory   helping hair, skin, and nails, as well as reducing stroke and heart attack risk.   It helps your bones and helps with mood.  It also decreases numerous cancer risks so please take it as directed.   Low Vit D is associated with a 200-300% higher risk for CANCER   and 200-300% higher risk for HEART   ATTACK  &  STROKE.   .....................................Amber Chan  It is also associated with higher death rate at younger ages,   autoimmune diseases like Rheumatoid arthritis, Lupus, Multiple Sclerosis.     Also many other serious conditions, like depression, Alzheimer's  Dementia, infertility, muscle aches, fatigue, fibromyalgia - just to name a few.  ++++++++++++++++++++++++++++++++++++++++++++++++  Recommend the book "The END of DIETING" by Dr Excell Seltzer   & the book "The END of DIABETES " by Dr Excell Seltzer  At [email or web address] - get book & Audio CD's     Being diabetic has a  300% increased risk for heart attack, stroke, cancer, and alzheimer- type vascular dementia. It is very important that you work harder with diet by avoiding all foods that are white. Avoid white rice (brown & wild rice is OK), white potatoes (sweetpotatoes in moderation is OK), White bread or wheat bread or anything made out of white flour like bagels, donuts, rolls, buns, biscuits, cakes, pastries, cookies, pizza crust, and pasta (made from white flour & egg whites) - vegetarian pasta or spinach or wheat pasta is OK. Multigrain breads like Arnold's or Pepperidge [employer], or multigrain sandwich thins or flatbreads.  Diet,  exercise and weight loss can reverse and cure diabetes in the early stages.  Diet, exercise and weight loss is very important in the control and prevention of complications of diabetes which affects every system in your body, ie. Brain - dementia/stroke, eyes - glaucoma/blindness, heart - heart attack/heart failure, kidneys - dialysis, stomach - gastric paralysis, intestines - malabsorption, nerves - severe painful neuritis, circulation - gangrene & loss of a leg(s), and finally cancer and Alzheimers.    I recommend avoid fried & greasy foods,  sweets/candy, white rice (brown or wild rice or Quinoa is OK), white potatoes (sweet potatoes are OK) - anything made from white flour - bagels, doughnuts, rolls, buns, biscuits,white and wheat breads, pizza crust and traditional pasta made of white flour & egg white(vegetarian pasta or spinach or wheat pasta is OK).  Multi-grain bread is OK - like multi-grain flat bread or sandwich thins. Avoid alcohol in excess. Exercise is also important.    Eat all the vegetables you want - avoid meat, especially red meat and dairy - especially cheese.  Cheese is the most concentrated form of trans-fats which is the worst thing to clog up our arteries. Veggie cheese is OK which can be found in the fresh produce section at [employer] or [employer] or [employer]  ++++++++++++++++++++++++++++++++++++++++++++++++++ DASH Eating Plan  DASH stands for "Dietary Approaches to Stop Hypertension."   The DASH eating plan is a healthy eating plan that has been shown to reduce high blood  pressure (hypertension). Additional health benefits may include reducing the risk of type 2 diabetes mellitus, heart disease, and stroke. The DASH eating plan may also help with weight loss.  WHAT DO I NEED TO KNOW ABOUT THE DASH EATING PLAN?  For the DASH eating plan, you will follow these general guidelines:  Choose foods with a percent daily value for sodium of less than 5% (as listed on the food  label).  Use salt-free seasonings or herbs instead of table salt or sea salt.  Check with your health care provider or pharmacist before using salt substitutes.  Eat lower-sodium products, often labeled as "lower sodium" or "no salt added."  Eat fresh foods.  Eat more vegetables, fruits, and low-fat dairy products.    Choose whole grains. Look for the word "whole" as the first word in the ingredient list.  Choose fish   Limit sweets, desserts, sugars, and sugary drinks.  Choose heart-healthy fats.  Eat veggie cheese   Eat more home-cooked food and less restaurant, buffet, and fast food.  Limit fried foods.  [place]uffaker foods using methods other than frying.  Limit canned vegetables. If you do use them, rinse them well to decrease the sodium.  When eating at a restaurant, ask that your food be prepared with less salt, or no salt if possible.                      WHAT FOODS CAN I EAT?  Read Dr Fara Olden Fuhrman's books on The End of Dieting & The End of Diabetes  Grains  Whole grain or whole wheat bread. Brown rice. Whole grain or whole wheat pasta. Quinoa, bulgur, and whole grain cereals. Low-sodium cereals. Corn or whole wheat flour tortillas. Whole grain cornbread. Whole grain crackers. Low-sodium crackers.  Vegetables  Fresh or frozen vegetables (raw, steamed, roasted, or grilled). Low-sodium or reduced-sodium tomato and vegetable juices. Low-sodium or reduced-sodium tomato sauce and paste. Low-sodium or reduced-sodium canned vegetables.   Fruits  All fresh, canned (in natural juice), or frozen fruits.  Protein Products   All fish and seafood.  Dried beans, peas, or lentils. Unsalted nuts and seeds. Unsalted canned beans.  Dairy  Low-fat dairy products, such as skim or 1% milk, 2% or reduced-fat cheeses, low-fat ricotta or cottage cheese, or plain low-fat yogurt. Low-sodium or reduced-sodium cheeses.  Fats and Oils  Tub margarines without trans fats. Light or  reduced-fat mayonnaise and salad dressings (reduced sodium). Avocado. Safflower, olive, or canola oils. Natural peanut or almond butter.  Other  Unsalted popcorn and pretzels. The items listed above may not be a complete list of recommended foods or beverages. Contact your dietitian for more options.  +++++++++++++++++++++++++++++++++++++++++++  WHAT FOODS ARE NOT RECOMMENDED?  Grains/ White flour or wheat flour  White bread. White pasta. White rice. Refined cornbread. Bagels and croissants. Crackers that contain trans fat.  Vegetables  Creamed or fried vegetables. Vegetables in a . Regular canned vegetables. Regular canned tomato sauce and paste. Regular tomato and vegetable juices.  Fruits  Dried fruits. Canned fruit in light or heavy syrup. Fruit juice.  Meat and Other Protein Products  Meat in general - RED mwaet & White meat.  Fatty cuts of meat. Ribs, chicken wings, bacon, sausage, bologna, salami, chitterlings, fatback, hot dogs, bratwurst, and packaged luncheon meats.  Dairy  Whole or 2% milk, cream, half-and-half, and cream cheese. Whole-fat or sweetened yogurt. Full-fat cheeses or blue cheese. Nondairy creamers and whipped toppings. Processed cheese, cheese spreads, or  cheese curds.  Condiments  Onion and garlic salt, seasoned salt, table salt, and sea salt. Canned and packaged gravies. Worcestershire sauce. Tartar sauce. Barbecue sauce. Teriyaki sauce. Soy sauce, including reduced sodium. Steak sauce. Fish sauce. Oyster sauce. Cocktail sauce. Horseradish. Ketchup and mustard. Meat flavorings and tenderizers. Bouillon cubes. Hot sauce. Tabasco sauce. Marinades. Taco seasonings. Relishes.  Fats and Oils Butter, stick margarine, lard, shortening and bacon fat. Coconut, palm kernel, or palm oils. Regular salad dressings.  Pickles and olives. Salted popcorn and pretzels.  The items listed above may not be a complete list of foods and beverages to avoid.   Preventive  Care for Adults  A healthy lifestyle and preventive care can promote health and wellness. Preventive health guidelines for women include the following key practices.  A routine yearly physical is a good way to check with your health care provider about your health and preventive screening. It is a chance to share any concerns and updates on your health and to receive a thorough exam.  Visit your dentist for a routine exam and preventive care every 6 months. Brush your teeth twice a day and floss once a day. Good oral hygiene prevents tooth decay and gum disease.  The frequency of eye exams is based on your age, health, family medical history, use of contact lenses, and other factors. Follow your health care provider's recommendations for frequency of eye exams.  Eat a healthy diet. Foods like vegetables, fruits, whole grains, low-fat dairy products, and lean protein foods contain the nutrients you need without too many calories. Decrease your intake of foods high in solid fats, added sugars, and salt. Eat the right amount of calories for you.Get information about a proper diet from your health care provider, if necessary.  Regular physical exercise is one of the most important things you can do for your health. Most adults should get at least 150 minutes of moderate-intensity exercise (any activity that increases your heart rate and causes you to sweat) each week. In addition, most adults need muscle-strengthening exercises on 2 or more days a week.  Maintain a healthy weight. The body mass index (BMI) is a screening tool to identify possible weight problems. It provides an estimate of body fat based on height and weight. Your health care provider can find your BMI and can help you achieve or maintain a healthy weight.For adults 20 years and older:  A BMI below 18.5 is considered underweight.  A BMI of 18.5 to 24.9 is normal.  A BMI of 25 to 29.9 is considered overweight.  A BMI of 30 and  above is considered obese.  Maintain normal blood lipids and cholesterol levels by exercising and minimizing your intake of saturated fat. Eat a balanced diet with plenty of fruit and vegetables. If your lipid or cholesterol levels are high, you are over 50, or you are at high risk for heart disease, you may need your cholesterol levels checked more frequently.Ongoing high lipid and cholesterol levels should be treated with medicines if diet and exercise are not working.  If you smoke, find out from your health care provider how to quit. If you do not use tobacco, do not start.  Lung cancer screening is recommended for adults aged 55-80 years who are at high risk for developing lung cancer because of a history of smoking. A yearly low-dose CT scan of the lungs is recommended for people who have at least a 30-pack-year history of smoking and are a current smoker or   have quit within the past 15 years. A pack year of smoking is smoking an average of 1 pack of cigarettes a day for 1 year (for example: 1 pack a day for 30 years or 2 packs a day for 15 years). Yearly screening should continue until the smoker has stopped smoking for at least 15 years. Yearly screening should be stopped for people who develop a health problem that would prevent them from having lung cancer treatment.  Avoid use of street drugs. Do not share needles with anyone. Ask for help if you need support or instructions about stopping the use of drugs.  High blood pressure causes heart disease and increases the risk of stroke.  Ongoing high blood pressure should be treated with medicines if weight loss and exercise do not work.  If you are 55-79 years old, ask your health care provider if you should take aspirin to prevent strokes.  Diabetes screening involves taking a blood sample to check your fasting blood sugar level. This should be done once every 3 years, after age 45, if you are within normal weight and without risk factors for  diabetes. Testing should be considered at a younger age or be carried out more frequently if you are overweight and have at least 1 risk factor for diabetes.  Breast cancer screening is essential preventive care for women. You should practice "breast self-awareness." This means understanding the normal appearance and feel of your breasts and may include breast self-examination. Any changes detected, no matter how small, should be reported to a health care provider. Women in their 20s and 30s should have a clinical breast exam (CBE) by a health care provider as part of a regular health exam every 1 to 3 years. After age 40, women should have a CBE every year. Starting at age 40, women should consider having a mammogram (breast X-ray test) every year. Women who have a family history of breast cancer should talk to their health care provider about genetic screening. Women at a high risk of breast cancer should talk to their health care providers about having an MRI and a mammogram every year.  Breast cancer gene (BRCA)-related cancer risk assessment is recommended for women who have family members with BRCA-related cancers. BRCA-related cancers include breast, ovarian, tubal, and peritoneal cancers. Having family members with these cancers may be associated with an increased risk for harmful changes (mutations) in the breast cancer genes BRCA1 and BRCA2. Results of the assessment will determine the need for genetic counseling and BRCA1 and BRCA2 testing.  Routine pelvic exams to screen for cancer are no longer recommended for nonpregnant women who are considered low risk for cancer of the pelvic organs (ovaries, uterus, and vagina) and who do not have symptoms. Ask your health care provider if a screening pelvic exam is right for you.  If you have had past treatment for cervical cancer or a condition that could lead to cancer, you need Pap tests and screening for cancer for at least 20 years after your  treatment. If Pap tests have been discontinued, your risk factors (such as having a new sexual partner) need to be reassessed to determine if screening should be resumed. Some women have medical problems that increase the chance of getting cervical cancer. In these cases, your health care provider may recommend more frequent screening and Pap tests.    Colorectal cancer can be detected and often prevented. Most routine colorectal cancer screening begins at the age of 50 years and   continues through age 75 years. However, your health care provider may recommend screening at an earlier age if you have risk factors for colon cancer. On a yearly basis, your health care provider may provide home test kits to check for hidden blood in the stool. Use of a small camera at the end of a tube, to directly examine the colon (sigmoidoscopy or colonoscopy), can detect the earliest forms of colorectal cancer. Talk to your health care provider about this at age 50, when routine screening begins. Direct exam of the colon should be repeated every 5-10 years through age 75 years, unless early forms of pre-cancerous polyps or small growths are found.  Osteoporosis is a disease in which the bones lose minerals and strength with aging. This can result in serious bone fractures or breaks. The risk of osteoporosis can be identified using a bone density scan. Women ages 65 years and over and women at risk for fractures or osteoporosis should discuss screening with their health care providers. Ask your health care provider whether you should take a calcium supplement or vitamin D to reduce the rate of osteoporosis.  Menopause can be associated with physical symptoms and risks. Hormone replacement therapy is available to decrease symptoms and risks. You should talk to your health care provider about whether hormone replacement therapy is right for you.  Use sunscreen. Apply sunscreen liberally and repeatedly throughout the day. You  should seek shade when your shadow is shorter than you. Protect yourself by wearing long sleeves, pants, a wide-brimmed hat, and sunglasses year round, whenever you are outdoors.  Once a month, do a whole body skin exam, using a mirror to look at the skin on your back. Tell your health care provider of new moles, moles that have irregular borders, moles that are larger than a pencil eraser, or moles that have changed in shape or color.  Stay current with required vaccines (immunizations).  Influenza vaccine. All adults should be immunized every year.  Tetanus, diphtheria, and acellular pertussis (Td, Tdap) vaccine. Pregnant women should receive 1 dose of Tdap vaccine during each pregnancy. The dose should be obtained regardless of the length of time since the last dose. Immunization is preferred during the 27th-36th week of gestation. An adult who has not previously received Tdap or who does not know her vaccine status should receive 1 dose of Tdap. This initial dose should be followed by tetanus and diphtheria toxoids (Td) booster doses every 10 years. Adults with an unknown or incomplete history of completing a 3-dose immunization series with Td-containing vaccines should begin or complete a primary immunization series including a Tdap dose. Adults should receive a Td booster every 10 years.    Zoster vaccine. One dose is recommended for adults aged 60 years or older unless certain conditions are present.    Pneumococcal 13-valent conjugate (PCV13) vaccine. When indicated, a person who is uncertain of her immunization history and has no record of immunization should receive the PCV13 vaccine. An adult aged 19 years or older who has certain medical conditions and has not been previously immunized should receive 1 dose of PCV13 vaccine. This PCV13 should be followed with a dose of pneumococcal polysaccharide (PPSV23) vaccine. The PPSV23 vaccine dose should be obtained at least 8 weeks after the dose  of PCV13 vaccine. An adult aged 19 years or older who has certain medical conditions and previously received 1 or more doses of PPSV23 vaccine should receive 1 dose of PCV13. The PCV13 vaccine dose should   be obtained 1 or more years after the last PPSV23 vaccine dose.    Pneumococcal polysaccharide (PPSV23) vaccine. When PCV13 is also indicated, PCV13 should be obtained first. All adults aged 65 years and older should be immunized. An adult younger than age 65 years who has certain medical conditions should be immunized. Any person who resides in a nursing home or long-term care facility should be immunized. An adult smoker should be immunized. People with an immunocompromised condition and certain other conditions should receive both PCV13 and PPSV23 vaccines. People with human immunodeficiency virus (HIV) infection should be immunized as soon as possible after diagnosis. Immunization during chemotherapy or radiation therapy should be avoided. Routine use of PPSV23 vaccine is not recommended for American Indians, [place] Natives, or people younger than 65 years unless there are medical conditions that require PPSV23 vaccine. When indicated, people who have unknown immunization and have no record of immunization should receive PPSV23 vaccine. One-time revaccination 5 years after the first dose of PPSV23 is recommended for people aged 19-64 years who have chronic kidney failure, nephrotic syndrome, asplenia, or immunocompromised conditions. People who received 1-2 doses of PPSV23 before age 65 years should receive another dose of PPSV23 vaccine at age 65 years or later if at least 5 years have passed since the previous dose. Doses of PPSV23 are not needed for people immunized with PPSV23 at or after age 65 years.   Preventive Services / Frequency  Ages 65 years and over  Blood pressure check.  Lipid and cholesterol check.  Lung cancer screening. / Every year if you are aged 55-80 years and have a  30-pack-year history of smoking and currently smoke or have quit within the past 15 years. Yearly screening is stopped once you have quit smoking for at least 15 years or develop a health problem that would prevent you from having lung cancer treatment.  Clinical breast exam.** / Every year after age 40 years.  BRCA-related cancer risk assessment.** / For women who have family members with a BRCA-related cancer (breast, ovarian, tubal, or peritoneal cancers).  Mammogram.** / Every year beginning at age 40 years and continuing for as long as you are in good health. Consult with your health care provider.  Pap test.** / Every 3 years starting at age 30 years through age 65 or 70 years with 3 consecutive normal Pap tests. Testing can be stopped between 65 and 70 years with 3 consecutive normal Pap tests and no abnormal Pap or HPV tests in the past 10 years.  Fecal occult blood test (FOBT) of stool. / Every year beginning at age 50 years and continuing until age 75 years. You may not need to do this test if you get a colonoscopy every 10 years.  Flexible sigmoidoscopy or colonoscopy.** / Every 5 years for a flexible sigmoidoscopy or every 10 years for a colonoscopy beginning at age 50 years and continuing until age 75 years.  Hepatitis C blood test.** / For all people born from 1945 through 1965 and any individual with known risks for hepatitis C.  Osteoporosis screening.** / A one-time screening for women ages 65 years and over and women at risk for fractures or osteoporosis.  Skin self-exam. / Monthly.  Influenza vaccine. / Every year.  Tetanus, diphtheria, and acellular pertussis (Tdap/Td) vaccine.** / 1 dose of Td every 10 years.  Zoster vaccine.** / 1 dose for adults aged 60 years or older.  Pneumococcal 13-valent conjugate (PCV13) vaccine.** / Consult your health care provider.    Pneumococcal polysaccharide (PPSV23) vaccine.** / 1 dose for all adults aged 65 years and older. Screening  for abdominal aortic aneurysm (AAA)  by ultrasound is recommended for people who have history of high blood pressure or who are current or former smokers. 

## 2015-11-03 ENCOUNTER — Ambulatory Visit (INDEPENDENT_AMBULATORY_CARE_PROVIDER_SITE_OTHER): Payer: Medicare Other | Admitting: Internal Medicine

## 2015-11-03 ENCOUNTER — Encounter: Payer: Self-pay | Admitting: Internal Medicine

## 2015-11-03 VITALS — BP 112/60 | HR 68 | Temp 97.3°F | Resp 16 | Ht 69.0 in | Wt 117.4 lb

## 2015-11-03 DIAGNOSIS — E039 Hypothyroidism, unspecified: Secondary | ICD-10-CM | POA: Diagnosis not present

## 2015-11-03 DIAGNOSIS — E559 Vitamin D deficiency, unspecified: Secondary | ICD-10-CM | POA: Diagnosis not present

## 2015-11-03 DIAGNOSIS — K589 Irritable bowel syndrome without diarrhea: Secondary | ICD-10-CM

## 2015-11-03 DIAGNOSIS — I10 Essential (primary) hypertension: Secondary | ICD-10-CM

## 2015-11-03 DIAGNOSIS — R0989 Other specified symptoms and signs involving the circulatory and respiratory systems: Secondary | ICD-10-CM

## 2015-11-03 DIAGNOSIS — E782 Mixed hyperlipidemia: Secondary | ICD-10-CM | POA: Diagnosis not present

## 2015-11-03 DIAGNOSIS — Z136 Encounter for screening for cardiovascular disorders: Secondary | ICD-10-CM | POA: Diagnosis not present

## 2015-11-03 DIAGNOSIS — Z79899 Other long term (current) drug therapy: Secondary | ICD-10-CM

## 2015-11-03 DIAGNOSIS — Z1212 Encounter for screening for malignant neoplasm of rectum: Secondary | ICD-10-CM

## 2015-11-03 DIAGNOSIS — R7303 Prediabetes: Secondary | ICD-10-CM | POA: Diagnosis not present

## 2015-11-03 LAB — BASIC METABOLIC PANEL WITH GFR
BUN: 28 mg/dL — ABNORMAL HIGH (ref 7–25)
CALCIUM: 8.8 mg/dL (ref 8.6–10.4)
CO2: 27 mmol/L (ref 20–31)
CREATININE: 1.21 mg/dL — AB (ref 0.60–0.93)
Chloride: 107 mmol/L (ref 98–110)
GFR, Est African American: 50 mL/min — ABNORMAL LOW (ref >=60)
GFR, Est Non African American: 43 mL/min — ABNORMAL LOW (ref >=60)
GLUCOSE: 107 mg/dL — AB (ref 65–99)
Potassium: 4.2 mmol/L (ref 3.5–5.3)
Sodium: 143 mmol/L (ref 135–146)

## 2015-11-03 LAB — CBC WITH DIFFERENTIAL/PLATELET
BASOS ABS: 0 {cells}/uL (ref 0–200)
Basophils Relative: 0 %
EOS ABS: 62 {cells}/uL (ref 15–500)
Eosinophils Relative: 1 %
HEMATOCRIT: 37.3 % (ref 35.0–45.0)
HEMOGLOBIN: 12.6 g/dL (ref 11.7–15.5)
LYMPHS ABS: 992 {cells}/uL (ref 850–3900)
Lymphocytes Relative: 16 %
MCH: 33.3 pg — AB (ref 27.0–33.0)
MCHC: 33.8 g/dL (ref 32.0–36.0)
MCV: 98.7 fL (ref 80.0–100.0)
MONO ABS: 620 {cells}/uL (ref 200–950)
MPV: 9.6 fL (ref 7.5–12.5)
Monocytes Relative: 10 %
NEUTROS PCT: 73 %
Neutro Abs: 4526 cells/uL (ref 1500–7800)
Platelets: 249 10*3/uL (ref 140–400)
RBC: 3.78 MIL/uL — ABNORMAL LOW (ref 3.80–5.10)
RDW: 14.3 % (ref 11.0–15.0)
WBC: 6.2 10*3/uL (ref 3.8–10.8)

## 2015-11-03 LAB — LIPID PANEL
CHOL/HDL RATIO: 2.9 ratio (ref <=5.0)
Cholesterol: 171 mg/dL (ref 125–200)
HDL: 60 mg/dL (ref >=46)
LDL CALC: 97 mg/dL (ref <130)
Triglycerides: 69 mg/dL (ref <150)
VLDL: 14 mg/dL (ref <30)

## 2015-11-03 LAB — HEPATIC FUNCTION PANEL
ALK PHOS: 68 U/L (ref 33–130)
ALT: 11 U/L (ref 6–29)
AST: 17 U/L (ref 10–35)
Albumin: 3.8 g/dL (ref 3.6–5.1)
BILIRUBIN DIRECT: 0.1 mg/dL (ref <=0.2)
BILIRUBIN INDIRECT: 0.4 mg/dL (ref 0.2–1.2)
BILIRUBIN TOTAL: 0.5 mg/dL (ref 0.2–1.2)
Total Protein: 6.3 g/dL (ref 6.1–8.1)

## 2015-11-03 LAB — HEMOGLOBIN A1C
Hgb A1c MFr Bld: 5.6 % (ref <5.7)
Mean Plasma Glucose: 114 mg/dL

## 2015-11-03 LAB — TSH: TSH: 2.27 mIU/L

## 2015-11-03 LAB — MAGNESIUM: Magnesium: 2 mg/dL (ref 1.5–2.5)

## 2015-11-03 MED ORDER — CITALOPRAM HYDROBROMIDE 10 MG PO TABS: ORAL_TABLET | ORAL | Status: DC

## 2015-11-04 LAB — URINALYSIS, ROUTINE W REFLEX MICROSCOPIC

## 2015-11-04 LAB — VITAMIN D 25 HYDROXY (VIT D DEFICIENCY, FRACTURES): VIT D 25 HYDROXY: 62 ng/mL (ref 30–100)

## 2015-11-04 LAB — INSULIN, RANDOM: Insulin: 16.6 u[IU]/mL (ref 2.0–19.6)

## 2015-11-04 LAB — MICROALBUMIN / CREATININE URINE RATIO

## 2015-11-10 ENCOUNTER — Telehealth: Payer: Self-pay | Admitting: *Deleted

## 2015-11-10 NOTE — Telephone Encounter (Signed)
Spouse called and states the patient has not been eating since yesterday but is drinking fluids.  The spouse asked if the Citalopram could be the cause of her lack of appetite.  Per Dr Melford Aase, try to continue the medication and call if it does not improve.

## 2015-11-19 ENCOUNTER — Observation Stay (HOSPITAL_COMMUNITY)
Admission: EM | Admit: 2015-11-19 | Discharge: 2015-11-21 | Disposition: A | Discharge status: Skilled Nursing Facility | Payer: Medicare Other | Attending: Internal Medicine | Admitting: Internal Medicine

## 2015-11-19 ENCOUNTER — Encounter (HOSPITAL_COMMUNITY): Payer: Self-pay

## 2015-11-19 ENCOUNTER — Emergency Department (HOSPITAL_COMMUNITY): Payer: Medicare Other

## 2015-11-19 ENCOUNTER — Telehealth: Payer: Self-pay | Admitting: *Deleted

## 2015-11-19 ENCOUNTER — Other Ambulatory Visit: Payer: Self-pay | Admitting: Internal Medicine

## 2015-11-19 DIAGNOSIS — G308 Other Alzheimer's disease: Secondary | ICD-10-CM | POA: Diagnosis not present

## 2015-11-19 DIAGNOSIS — K838 Other specified diseases of biliary tract: Secondary | ICD-10-CM | POA: Diagnosis not present

## 2015-11-19 DIAGNOSIS — Z79899 Other long term (current) drug therapy: Secondary | ICD-10-CM | POA: Diagnosis not present

## 2015-11-19 DIAGNOSIS — G301 Alzheimer's disease with late onset: Secondary | ICD-10-CM | POA: Diagnosis not present

## 2015-11-19 DIAGNOSIS — E559 Vitamin D deficiency, unspecified: Secondary | ICD-10-CM | POA: Diagnosis not present

## 2015-11-19 DIAGNOSIS — T148 Other injury of unspecified body region: Secondary | ICD-10-CM | POA: Diagnosis not present

## 2015-11-19 DIAGNOSIS — M25559 Pain in unspecified hip: Secondary | ICD-10-CM | POA: Diagnosis not present

## 2015-11-19 DIAGNOSIS — S2242XA Multiple fractures of ribs, left side, initial encounter for closed fracture: Principal | ICD-10-CM | POA: Insufficient documentation

## 2015-11-19 DIAGNOSIS — I4892 Unspecified atrial flutter: Secondary | ICD-10-CM | POA: Diagnosis not present

## 2015-11-19 DIAGNOSIS — I10 Essential (primary) hypertension: Secondary | ICD-10-CM | POA: Diagnosis not present

## 2015-11-19 DIAGNOSIS — Z9013 Acquired absence of bilateral breasts and nipples: Secondary | ICD-10-CM | POA: Insufficient documentation

## 2015-11-19 DIAGNOSIS — Z66 Do not resuscitate: Secondary | ICD-10-CM | POA: Diagnosis not present

## 2015-11-19 DIAGNOSIS — F0280 Dementia in other diseases classified elsewhere without behavioral disturbance: Secondary | ICD-10-CM | POA: Diagnosis not present

## 2015-11-19 DIAGNOSIS — S32038A Other fracture of third lumbar vertebra, initial encounter for closed fracture: Secondary | ICD-10-CM | POA: Diagnosis not present

## 2015-11-19 DIAGNOSIS — W01198A Fall on same level from slipping, tripping and stumbling with subsequent striking against other object, initial encounter: Secondary | ICD-10-CM | POA: Diagnosis not present

## 2015-11-19 DIAGNOSIS — Z853 Personal history of malignant neoplasm of breast: Secondary | ICD-10-CM | POA: Insufficient documentation

## 2015-11-19 DIAGNOSIS — J90 Pleural effusion, not elsewhere classified: Secondary | ICD-10-CM | POA: Insufficient documentation

## 2015-11-19 DIAGNOSIS — Y92009 Unspecified place in unspecified non-institutional (private) residence as the place of occurrence of the external cause: Secondary | ICD-10-CM

## 2015-11-19 DIAGNOSIS — Z9181 History of falling: Secondary | ICD-10-CM | POA: Insufficient documentation

## 2015-11-19 DIAGNOSIS — R0989 Other specified symptoms and signs involving the circulatory and respiratory systems: Secondary | ICD-10-CM | POA: Diagnosis present

## 2015-11-19 DIAGNOSIS — F0390 Unspecified dementia without behavioral disturbance: Secondary | ICD-10-CM | POA: Diagnosis not present

## 2015-11-19 DIAGNOSIS — R0781 Pleurodynia: Secondary | ICD-10-CM | POA: Diagnosis not present

## 2015-11-19 DIAGNOSIS — S2249XA Multiple fractures of ribs, unspecified side, initial encounter for closed fracture: Secondary | ICD-10-CM | POA: Diagnosis present

## 2015-11-19 DIAGNOSIS — W19XXXA Unspecified fall, initial encounter: Secondary | ICD-10-CM

## 2015-11-19 DIAGNOSIS — E039 Hypothyroidism, unspecified: Secondary | ICD-10-CM | POA: Insufficient documentation

## 2015-11-19 DIAGNOSIS — S3991XA Unspecified injury of abdomen, initial encounter: Secondary | ICD-10-CM | POA: Diagnosis not present

## 2015-11-19 DIAGNOSIS — S32039A Unspecified fracture of third lumbar vertebra, initial encounter for closed fracture: Secondary | ICD-10-CM | POA: Insufficient documentation

## 2015-11-19 DIAGNOSIS — S3993XA Unspecified injury of pelvis, initial encounter: Secondary | ICD-10-CM | POA: Diagnosis not present

## 2015-11-19 DIAGNOSIS — Z7982 Long term (current) use of aspirin: Secondary | ICD-10-CM | POA: Insufficient documentation

## 2015-11-19 DIAGNOSIS — Y92002 Bathroom of unspecified non-institutional (private) residence single-family (private) house as the place of occurrence of the external cause: Secondary | ICD-10-CM | POA: Diagnosis not present

## 2015-11-19 LAB — I-STAT CHEM 8, ED
BUN: 24 mg/dL — AB (ref 6–20)
CALCIUM ION: 1.14 mmol/L (ref 1.12–1.23)
CHLORIDE: 99 mmol/L — AB (ref 101–111)
Creatinine, Ser: 1.1 mg/dL — ABNORMAL HIGH (ref 0.44–1.00)
GLUCOSE: 107 mg/dL — AB (ref 65–99)
HCT: 36 % (ref 36.0–46.0)
Hemoglobin: 12.2 g/dL (ref 12.0–15.0)
Potassium: 3.8 mmol/L (ref 3.5–5.1)
Sodium: 139 mmol/L (ref 135–145)
TCO2: 30 mmol/L (ref 0–100)

## 2015-11-19 LAB — CBC WITH DIFFERENTIAL/PLATELET
Basophils Absolute: 0 10*3/uL (ref 0.0–0.1)
Basophils Relative: 0 %
EOS ABS: 0.1 10*3/uL (ref 0.0–0.7)
EOS PCT: 1 %
HCT: 38.6 % (ref 36.0–46.0)
HEMOGLOBIN: 13.2 g/dL (ref 12.0–15.0)
LYMPHS ABS: 1.1 10*3/uL (ref 0.7–4.0)
Lymphocytes Relative: 15 %
MCH: 32.7 pg (ref 26.0–34.0)
MCHC: 34.2 g/dL (ref 30.0–36.0)
MCV: 95.5 fL (ref 78.0–100.0)
MONO ABS: 0.8 10*3/uL (ref 0.1–1.0)
MONOS PCT: 10 %
Neutro Abs: 5.5 10*3/uL (ref 1.7–7.7)
Neutrophils Relative %: 74 %
PLATELETS: 259 10*3/uL (ref 150–400)
RBC: 4.04 MIL/uL (ref 3.87–5.11)
RDW: 13.2 % (ref 11.5–15.5)
WBC: 7.5 10*3/uL (ref 4.0–10.5)

## 2015-11-19 MED ORDER — TRAMADOL HCL 50 MG PO TABS
50.0000 mg | ORAL_TABLET | Freq: Once | ORAL | Status: AC
  Administered 2015-11-19: 50 mg via ORAL
  Filled 2015-11-19: qty 1

## 2015-11-19 MED ORDER — IBUPROFEN 400 MG PO TABS
600.0000 mg | ORAL_TABLET | Freq: Once | ORAL | Status: DC
  Filled 2015-11-19: qty 3

## 2015-11-19 MED ORDER — IOPAMIDOL (ISOVUE-300) INJECTION 61%
100.0000 mL | Freq: Once | INTRAVENOUS | Status: AC | PRN
  Administered 2015-11-19: 100 mL via INTRAVENOUS

## 2015-11-19 MED ORDER — ACETAMINOPHEN 500 MG PO TABS
1000.0000 mg | ORAL_TABLET | Freq: Once | ORAL | Status: AC
  Administered 2015-11-19: 1000 mg via ORAL
  Filled 2015-11-19: qty 2

## 2015-11-19 MED ORDER — OXYCODONE-ACETAMINOPHEN 5-325 MG PO TABS
1.0000 | ORAL_TABLET | Freq: Once | ORAL | Status: AC
  Administered 2015-11-19: 1 via ORAL
  Filled 2015-11-19: qty 1

## 2015-11-19 NOTE — ED Provider Notes (Signed)
The patient's care was assumed for Dr. Venora Maples. Review of patient's chest x-ray showed 3 rib fractures and pleural effusion. I did follow-up with CT chest to make sure there was no larger fluid collection or pulmonary contusion as well as any evidence of solid organ injury. The patient has been having significant difficulty at home with assisting her husband to perform her ADLs. Patient does have severe dementia and is been having significant pain with movements. He has been having difficulty with toileting and keeping her clean. She has spent much of the day sitting in a chair without moving. At this time, the patient will be admitted for pain control and assessment of function at home and possible need for rehabilitation or skilled nursing versus additional in-home nursing care.   Charlesetta Shanks, MD 11/19/15 [phone]

## 2015-11-19 NOTE — ED Triage Notes (Signed)
Per EMS- patient lilves at home with the husband and has dementia. Patient had a fall 3 days ago. And was walking in the house with minimal assistance yesterday. Patient's husband reports that the patient slept in the chair last night and has been in the chair all day today. Patient's husband was concerned because when the patient moves she grimaces. EMS reports the same except when areas palpated,  no indication of pain.

## 2015-11-19 NOTE — Progress Notes (Signed)
EDCM spoke to patient and family at bedside. Patient's husband Amber Chan [phone]. Patient lives with her husband. Patient has a walker at home but she can't use it due to her dementia per husband, no other dme. Patient has never had home health services before per husband. EDCM provided patient with a list of home health agencies in [facility], explained services. Patient's husband reports the [facility] [place] [facility] him today, someone named Josh. No further EDCM needs at this time.

## 2015-11-19 NOTE — ED Notes (Signed)
Bed: [facility] Expected date:  Expected time:  Means of arrival:  Comments: EMS-fall 

## 2015-11-19 NOTE — ED Provider Notes (Signed)
[facility] DEPT Provider Note   CSN: [id number] Arrival date & time: 11/19/15  1611  First Provider Contact:  First MD Initiated Contact with Patient 11/19/15 1623        History   Chief Complaint Chief Complaint  Patient presents with  . Fall   L5 caveat: Dementia  HPI Amber Chan is a 81 y.o. female.  Patient presents to the emergency department after a fall 3 days ago.  Patient's husband is concerned because anytime he tries to move the patient she seems to grimace.  She has dementia and therefore she is not able to provide additional information.  He reports that she fell and struck the bathtub.  She's been eating and drinking normally today.  No reported illness or fever.         Past Medical History:  Diagnosis Date  . Atrial flutter ([place])   . Breast cancer ([place]) 1970   Right  . Dementia    moderate  . Hypothyroid   . IBS (irritable bowel syndrome)   . Labile hypertension   . Vitamin D deficiency     Patient Active Problem List   Diagnosis Date Noted  . BMI less than 19,adult 03/19/2015  . Atrial flutter ([place]) 09/27/2013  . Pericardial effusion 09/27/2013  . Vitamin D deficiency 08/13/2013  . Prediabetes 08/13/2013  . Hyperlipidemia 08/13/2013  . Medication management 08/13/2013  . SDAT (senile dementia of Alzheimer's type) 08/13/2013  . Labile hypertension   . Hypothyroid   . IBS (irritable bowel syndrome)   . Breast cancer [facility])     Past Surgical History:  Procedure Laterality Date  . ABLATION  08-28-2013   RFCA ablation of atrial flutter by Dr Lovena Le  . ATRIAL FLUTTER ABLATION N/A 08/28/2013   Procedure: ATRIAL FLUTTER ABLATION;  Surgeon: Evans Lance, MD;  Location: [facility] CATH LAB;  Service: Cardiovascular;  Laterality: N/A;  . BIOPSY THYROID     1999  . CHOLECYSTECTOMY     1988  . MASTECTOMY     right 1978 left 1980    OB History    No data available       Home Medications    Prior to Admission medications     Medication Sig Start Date End Date Taking? Authorizing Provider  aspirin 81 MG tablet Take 81 mg by mouth daily.    Historical Provider, MD  calcium carbonate (TUMS - DOSED IN MG ELEMENTAL CALCIUM) 500 MG chewable tablet Chew 2 tablets by mouth 2 (two) times daily.    Historical Provider, MD  Cholecalciferol (VITAMIN D PO) Take 2,000 Units by mouth 2 (two) times daily.     Historical Provider, MD  citalopram (CELEXA) 10 MG tablet Take 1 tablet daily for Anxiety 11/03/15 05/05/16  Unk Pinto, MD  levothyroxine (SYNTHROID, LEVOTHROID) 50 MCG tablet TAKE 1 TABLET EVERY DAY FOR THYROID 05/12/15   Unk Pinto, MD    Family History Family History  Problem Relation Age of Onset  . Hypertension Mother   . Cancer Mother     colon  . Stroke Father   . Heart disease Father   . Diabetes Brother     Social History Social History  Substance Use Topics  . Smoking status: Never Smoker  . Smokeless tobacco: Never Used  . Alcohol use No     Allergies   Review of patient's allergies indicates no known allergies.   Review of Systems Review of Systems  Unable to perform ROS: Dementia  Physical Exam Updated Vital Signs BP 141/86 (BP Location: Left Arm)   Pulse 74   Temp 98.2 F (36.8 C) (Oral)   Resp 18   SpO2 98% Comment: Simultaneous filing. User may not have seen previous data.  Physical Exam  Constitutional: She appears well-developed and well-nourished. No distress.  HENT:  Head: Normocephalic and atraumatic.  Eyes: EOM are normal.  Neck: Normal range of motion. Neck supple.  C-spine nontender  Cardiovascular: Normal rate, regular rhythm and normal heart sounds.   Pulmonary/Chest: Effort normal and breath sounds normal.  Tenderness left lateral lower chest wall  Abdominal: Soft. She exhibits no distension. There is no tenderness.  Musculoskeletal:  Full range of motion bilateral hips, knees, ankles.  Full range of motion bilateral wrists, elbows, shoulders.  Mild  bruising noted to her left low back left chest wall.  Neurological: She is alert.  Skin: Skin is warm and dry.  Psychiatric: She has a normal mood and affect. Judgment normal.  Nursing note and vitals reviewed.    ED Treatments / Results  Labs (all labs ordered are listed, but only abnormal results are displayed) Labs Reviewed - No data to display  EKG  EKG Interpretation None       Radiology No results found.  Procedures Procedures (including critical care time)  Medications Ordered in ED Medications  ibuprofen (ADVIL,MOTRIN) tablet 600 mg (600 mg Oral Not Given 11/19/15 1649)  oxyCODONE-acetaminophen (PERCOCET/ROXICET) 5-325 MG per tablet 1 tablet (1 tablet Oral Given 11/19/15 1649)     Initial Impression / Assessment and Plan / ED Course  I have reviewed the triage vital signs and the nursing notes.  Pertinent labs & imaging results that were available during my care of the patient were reviewed by me and considered in my medical decision making (see chart for details).  Clinical Course   Suspect possible left-sided rib fracture given the location of her bruising.  She does have full range of motion of her bilateral hips.  If her imaging is negative I think she is safe for discharge home with her husband.  Charlesetta Shanks, MD to follow up on imaging and istat chem 8   Final Clinical Impressions(s) / ED Diagnoses   Final diagnoses:  None    New Prescriptions New Prescriptions   No medications on file     Jola Schmidt, MD 11/19/15 1759

## 2015-11-19 NOTE — Telephone Encounter (Signed)
Spouse called and states he cannot move the patient to give her care, such as changing her diaper or transferring her from the chair back to her bed.  Dr Melford Aase spoke with the spouse and suggested he call 911 to transport her to the hospital to be evaluated due to her recent fall and change in her behavior.

## 2015-11-19 NOTE — H&P (Signed)
History and Physical    Amber Chan [id number] DOB: 03-06-1935 DOA: 11/19/2015  PCP: Alesia Richards, MD Consultants: None Patient coming from: home - lives with husband  Chief Complaint: fall  HPI: Amber Chan is a 81 y.o. female with medical history significant of advanced dementia presenting following a fall.  She was accompanied at the time of my evaluation by Bertram Millard, an ICU RN here who is a family friend.  Her husband is her primary caregiver and was sent home to rest.  They are in the process of requesting a hospice consult.  Patient's husband was helping her in bathroom Sunday morning and she fell, striking her left side.  Called family friend (Ruby) - patient grimacing with movement, but able to walk into living room.  He declined ER visit but decided today that since her pain was ongoing that he should bring her in.   Recently has gone into adult diapers.  Dementia 3-4 years, rapidly progressing over the last year.   ED Course: based on advanced dementia and rib fractures s/p fall, request hospitalists to admit for pain control and assessment of function at home and possible need for rehab vs. SNF vs. Additional in-home nursing care  Review of Systems:  Unable to perform due to advanced dementia   Ambulatory Status:  Ambulates if holding her hand; unable to use walker to cane due to advanced dementia  Past Medical History:  Diagnosis Date  . Atrial flutter ([facility])   . Breast cancer ([facility]) 1970   Right  . Dementia    moderate  . Hypothyroid   . IBS (irritable bowel syndrome)   . Labile hypertension   . Vitamin D deficiency     Past Surgical History:  Procedure Laterality Date  . ABLATION  08-28-2013   RFCA ablation of atrial flutter by Dr Taylor  . ATRIAL FLUTTER ABLATION N/A 08/28/2013   Procedure: ATRIAL FLUTTER ABLATION;  Surgeon: Gregg W Taylor, MD;  Location: [facility] CATH LAB;  Service: Cardiovascular;  Laterality: N/A;  . BIOPSY THYROID     1999  .  CHOLECYSTECTOMY     19 19  . MASTECTOMY     right 1978 left 1980    Social History   Social History  . Marital status: Married    Spouse name: N/A  . Number of children: N/A  . Years of education: N/A   Occupational History  . Not on file.   Social History Main Topics  . Smoking status: Never Smoker  . Smokeless tobacco: Never Used  . Alcohol use No  . Drug use: No  . Sexual activity: Not on file   Other Topics Concern  . Not on file   Social History Narrative   Lives in [place]    No Known Allergies  Family History  Problem Relation Age of Onset  . Hypertension Mother   . Cancer Mother     colon  . Stroke Father   . Heart disease Father   . Diabetes Brother     Prior to Admission medications   Medication Sig Start Date End Date Taking? Authorizing Provider  aspirin EC 81 MG tablet Take 81 mg by mouth every evening.   Yes Historical Provider, MD  cholecalciferol (VITAMIN D) 1000 units tablet Take 2,000 Units by mouth 2 (two) times daily.   Yes Historical Provider, MD  citalopram (CELEXA) 10 MG tablet Take 10 mg by mouth every evening.   Yes Historical Provider, MD  levothyroxine (SYNTHROID, LEVOTHROID) 50  MCG tablet Take 50 mcg by mouth daily before breakfast.   Yes Historical Provider, MD  Loma [place] (OYSTER CALCIUM) 500 MG TABS tablet Take 500 mg of elemental calcium by mouth every evening.   Yes Historical Provider, MD    Physical Exam: Vitals:   11/19/15 1622 11/19/15 1936 11/19/15 2233 11/19/15 2339  BP: 141/86 137/72 125/74 125/72  Pulse: 74 62 68 80  Resp: 18 16 18 18   Temp: 98.2 F (36.8 C) 98.1 F (36.7 C)    TempSrc: Oral Oral    SpO2: 98% 97% 96% 96%     General: Appears calm and comfortable and is NAD Eyes:  PERRL, EOMI, normal lids, iris ENT:  grossly normal hearing, lips & tongue, mmm Neck:  no LAD, masses or thyromegaly Cardiovascular:  RRR, no m/r/g. No LE edema.  Respiratory:  CTA bilaterally, no w/r/r. Normal respiratory  effort. Abdomen:  soft, ntnd, NABS Skin:  Ecchymoses present along left flank/lower posterolateral ribcage Musculoskeletal: no bony abnormality Psychiatric: occasionally attempts to answer questions appropriately, but primarily jumbled and nonsensical speech, not oriented to person Neurologic: unable to perform   Labs on Admission: I have personally reviewed following labs and imaging studies  CBC:  Recent Labs Lab 11/19/15 1810 11/19/15 1945  WBC  --  7.5  NEUTROABS  --  5.5  HGB 12.2 13.2  HCT 36.0 38.6  MCV  --  95.5  PLT  --  [id number]   Basic Metabolic Panel:  Recent Labs Lab 11/19/15 1810  NA 139  K 3.8  CL 99*  GLUCOSE 107*  BUN 24*  CREATININE 1.10*   GFR: CrCl cannot be calculated (Unknown ideal weight.). Liver Function Tests: No results for input(s): AST, ALT, ALKPHOS, BILITOT, PROT, ALBUMIN in the last 168 hours. No results for input(s): LIPASE, AMYLASE in the last 168 hours. No results for input(s): AMMONIA in the last 168 hours. Coagulation Profile: No results for input(s): INR, PROTIME in the last 168 hours. Cardiac Enzymes: No results for input(s): CKTOTAL, CKMB, CKMBINDEX, TROPONINI in the last 168 hours. BNP (last 3 results) No results for input(s): PROBNP in the last 8760 hours. HbA1C: No results for input(s): HGBA1C in the last 72 hours. CBG: No results for input(s): GLUCAP in the last 168 hours. Lipid Profile: No results for input(s): CHOL, HDL, LDLCALC, TRIG, CHOLHDL, LDLDIRECT in the last 72 hours. Thyroid Function Tests: No results for input(s): TSH, T4TOTAL, FREET4, T3FREE, THYROIDAB in the last 72 hours. Anemia Panel: No results for input(s): VITAMINB12, FOLATE, FERRITIN, TIBC, IRON, RETICCTPCT in the last 72 hours. Urine analysis:    Component Value Date/Time   COLORURINE CANCELED 11/03/2015 1115   APPEARANCEUR CANCELED 11/03/2015 1115   LABSPEC CANCELED 11/03/2015 1115   PHURINE CANCELED 11/03/2015 1115   GLUCOSEU CANCELED 11/03/2015  1115   HGBUR CANCELED 11/03/2015 1115   BILIRUBINUR CANCELED 11/03/2015 1115   KETONESUR CANCELED 11/03/2015 1115   PROTEINUR CANCELED 11/03/2015 1115   UROBILINOGEN 0.2 10/08/2014 0923   NITRITE CANCELED 11/03/2015 1115   LEUKOCYTESUR CANCELED 11/03/2015 1115    Creatinine Clearance: CrCl cannot be calculated (Unknown ideal weight.).  Sepsis Labs: @LABRCNTIP (procalcitonin:4,lacticidven:4) )No results found for this or any previous visit (from the past 240 hour(s)).   Radiological Exams on Admission: Dg Ribs Unilateral W/chest Left  Result Date: 11/19/2015 CLINICAL DATA:  Fall 3 days ago.  Left upper rib pain. EXAM: LEFT RIBS AND CHEST - 3+ VIEW COMPARISON:  Two-view chest x-ray 08/28/2013. FINDINGS: The heart is mildly enlarged. Left  ninth rib fracture is displaced approximately half the shaft width. More minimally displaced left tenth and eleventh rib fractures are noted. There is no pneumothorax. A left pleural effusion is present. Left basilar airspace disease is noted. Atherosclerotic calcifications are present at the aorta. A remote granuloma is present in the right upper lobe. IMPRESSION: 1. Left ninth, tenth, and eleventh rib fractures. The ninth rib fracture is the most significantly displaced. 2. No pneumothorax. 3. Left pleural effusion and airspace disease worrisome for hemorrhage and contusion. 4. Atherosclerosis. Electronically Signed   By: San Morelle M.D.   On: 11/19/2015 18:29   Dg Pelvis 1-2 Views  Result Date: 11/19/2015 CLINICAL DATA:  81 year old female fell 3 days ago, grimaces with movement. Initial encounter. EXAM: PELVIS - 1-2 VIEW COMPARISON:  None. FINDINGS: Bone mineralization is within normal limits for age. Femoral heads normally located. Hip joint spaces preserved. Pelvis intact. Grossly intact proximal femurs. Abundant bowel gas in the lower abdomen and pelvis but no abnormally dilated loops. IMPRESSION: No acute osseous abnormality identified about the  pelvis. Electronically Signed   By: Genevie Ann M.D.   On: 11/19/2015 18:30   Ct Chest W Contrast  Result Date: 11/19/2015 CLINICAL DATA:  Status post fall. EXAM: CT CHEST, ABDOMEN, AND PELVIS WITH CONTRAST TECHNIQUE: Multidetector CT imaging of the chest, abdomen and pelvis was performed following the standard protocol during bolus administration of intravenous contrast. CONTRAST:  117mL ISOVUE-300 IOPAMIDOL (ISOVUE-300) INJECTION 61% COMPARISON:  CT scan of Aug 25, 2013. FINDINGS: CT CHEST No pneumothorax is noted. Calcified granuloma is noted in right upper lobe. Stable biapical scarring is noted. Minimal left pleural effusion is noted with adjacent subsegmental atelectasis. Mildly displaced fractures are seen involving the posterior portions of the left eleventh and twelfth ribs. No mediastinal mass or adenopathy is noted. Mild pericardial effusion is noted. There is no evidence of thoracic aortic dissection or aneurysm. CT ABDOMEN AND PELVIS Status post cholecystectomy. Intrahepatic and extrahepatic biliary dilatation is noted most consistent with post cholecystectomy status. The spleen and pancreas are unremarkable. Adrenal glands and kidneys appear normal. No hydronephrosis or renal obstruction is noted. There is no evidence of abdominal aortic aneurysm or dissection. There is no evidence of bowel obstruction. Urinary bladder is unremarkable. Uterus and ovaries are unremarkable. No abnormal fluid collection is noted. Minimally displaced fracture is seen involving the left transverse process of L3. IMPRESSION: Mildly displaced left eleventh and twelfth rib fractures. Minimal left pleural effusion is noted with adjacent subsegmental atelectasis. Intrahepatic and extrahepatic biliary dilatation is noted most consistent with post cholecystectomy status. Minimally displaced fracture involving left transverse process of L3. No other abnormality seen in the abdomen or pelvis. Electronically Signed   By: Marijo Conception, M.D.   On: 11/19/2015 20:46   Ct Abdomen Pelvis W Contrast  Result Date: 11/19/2015 CLINICAL DATA:  Status post fall. EXAM: CT CHEST, ABDOMEN, AND PELVIS WITH CONTRAST TECHNIQUE: Multidetector CT imaging of the chest, abdomen and pelvis was performed following the standard protocol during bolus administration of intravenous contrast. CONTRAST:  169mL ISOVUE-300 IOPAMIDOL (ISOVUE-300) INJECTION 61% COMPARISON:  CT scan of Aug 25, 2013. FINDINGS: CT CHEST No pneumothorax is noted. Calcified granuloma is noted in right upper lobe. Stable biapical scarring is noted. Minimal left pleural effusion is noted with adjacent subsegmental atelectasis. Mildly displaced fractures are seen involving the posterior portions of the left eleventh and twelfth ribs. No mediastinal mass or adenopathy is noted. Mild pericardial effusion is noted. There is no evidence  of thoracic aortic dissection or aneurysm. CT ABDOMEN AND PELVIS Status post cholecystectomy. Intrahepatic and extrahepatic biliary dilatation is noted most consistent with post cholecystectomy status. The spleen and pancreas are unremarkable. Adrenal glands and kidneys appear normal. No hydronephrosis or renal obstruction is noted. There is no evidence of abdominal aortic aneurysm or dissection. There is no evidence of bowel obstruction. Urinary bladder is unremarkable. Uterus and ovaries are unremarkable. No abnormal fluid collection is noted. Minimally displaced fracture is seen involving the left transverse process of L3. IMPRESSION: Mildly displaced left eleventh and twelfth rib fractures. Minimal left pleural effusion is noted with adjacent subsegmental atelectasis. Intrahepatic and extrahepatic biliary dilatation is noted most consistent with post cholecystectomy status. Minimally displaced fracture involving left transverse process of L3. No other abnormality seen in the abdomen or pelvis. Electronically Signed   By: Marijo Conception, M.D.   On: 11/19/2015 20:46      EKG: Not done  Assessment/Plan Principal Problem:   Multiple rib fractures Active Problems:   Labile hypertension   Hypothyroid   SDAT (senile dementia of Alzheimer's type)   L3 vertebral fracture ([facility]CC)   Fractures of left ribs 9-12 and minimally displaced fracture involving left transverse process of L3 -Patient with advanced dementia being cared for at home by elderly husband who had traumatic fractures following a fall on Saturday -Will place in observation status for pain control -Initial pain control with Tylenol and Toradol, low threshold to add morphine if not comfortable -PT consult -Case manager consult for possible home health needs - although patient and husband would probably benefit from placement if he is willing  Dementia, advanced -Patient is not oriented at all and minimally conversant -Has family friend who is an Therapist, sports who will stay at bedside overnight with patient in case of behavioral/sundowning issues to provide respite for patient's husband -Family friend believes that patient is DNR but there is no documentation to support this and she was full code at last admission -Friend requests that we make patient full code for tonight and not awaken husband -Will readdress code status in AM -Patient is scheduled to undergo hospice evaluation at home; would like palliative care consult in AM (day team to call) -Continue Celexa  Hypothyroidism -TSH 2.27 on 7/17 -Continue Synthroid at current dosing  HTN -Good control thus far today -Not taking medications  DVT prophylaxis:  Lovenox Code Status:  Full - confirmed with friend for now but needs reconsideration in AM Family Communication: Husband not present at time of admission; family friend/RN present throughout Disposition Plan: To be determined, would likely benefit from rehab or SNF Consults called: Palliative care in AM; case manager and PT requested Admission status: Observation - Med Surg   Karmen Bongo MD [facility]  If 7PM-7AM, please contact night-coverage [email or web address] Password [email or web address]  11/20/2015, 12:11 AM

## 2015-11-20 DIAGNOSIS — G308 Other Alzheimer's disease: Secondary | ICD-10-CM

## 2015-11-20 DIAGNOSIS — S32038A Other fracture of third lumbar vertebra, initial encounter for closed fracture: Secondary | ICD-10-CM | POA: Diagnosis not present

## 2015-11-20 DIAGNOSIS — S2242XA Multiple fractures of ribs, left side, initial encounter for closed fracture: Principal | ICD-10-CM

## 2015-11-20 DIAGNOSIS — I10 Essential (primary) hypertension: Secondary | ICD-10-CM | POA: Diagnosis not present

## 2015-11-20 DIAGNOSIS — S32039A Unspecified fracture of third lumbar vertebra, initial encounter for closed fracture: Secondary | ICD-10-CM | POA: Diagnosis present

## 2015-11-20 DIAGNOSIS — E039 Hypothyroidism, unspecified: Secondary | ICD-10-CM | POA: Diagnosis not present

## 2015-11-20 LAB — BASIC METABOLIC PANEL
ANION GAP: 6 (ref 5–15)
BUN: 20 mg/dL (ref 6–20)
CALCIUM: 8.7 mg/dL — AB (ref 8.9–10.3)
CHLORIDE: 104 mmol/L (ref 101–111)
CO2: 29 mmol/L (ref 22–32)
CREATININE: 0.83 mg/dL (ref 0.44–1.00)
GFR calc non Af Amer: >60 mL/min (ref >60)
Glucose, Bld: 100 mg/dL — ABNORMAL HIGH (ref 65–99)
Potassium: 4 mmol/L (ref 3.5–5.1)
SODIUM: 139 mmol/L (ref 135–145)

## 2015-11-20 LAB — TYPE AND SCREEN
ABO/RH(D): O POS
Antibody Screen: POSITIVE
DAT, IgG: POSITIVE

## 2015-11-20 LAB — CBC
HCT: 38.5 % (ref 36.0–46.0)
HEMOGLOBIN: 13.1 g/dL (ref 12.0–15.0)
MCH: 32.6 pg (ref 26.0–34.0)
MCHC: 34 g/dL (ref 30.0–36.0)
MCV: 95.8 fL (ref 78.0–100.0)
PLATELETS: 265 10*3/uL (ref 150–400)
RBC: 4.02 MIL/uL (ref 3.87–5.11)
RDW: 13.3 % (ref 11.5–15.5)
WBC: 5.7 10*3/uL (ref 4.0–10.5)

## 2015-11-20 MED ORDER — ONDANSETRON HCL 4 MG/2ML IJ SOLN: 4.0000 mg | Freq: Four times a day (QID) | INTRAMUSCULAR | Status: DC | PRN

## 2015-11-20 MED ORDER — KETOROLAC TROMETHAMINE 15 MG/ML IJ SOLN
15.0000 mg | Freq: Four times a day (QID) | INTRAMUSCULAR | Status: DC | PRN
  Administered 2015-11-20: 15 mg via INTRAVENOUS
  Filled 2015-11-20: qty 1

## 2015-11-20 MED ORDER — ACETAMINOPHEN 650 MG RE SUPP: 650.0000 mg | Freq: Four times a day (QID) | RECTAL | Status: DC | PRN

## 2015-11-20 MED ORDER — ENOXAPARIN SODIUM 40 MG/0.4ML ~~LOC~~ SOLN
40.0000 mg | SUBCUTANEOUS | Status: DC
  Filled 2015-11-20: qty 0.4

## 2015-11-20 MED ORDER — CITALOPRAM HYDROBROMIDE 20 MG PO TABS
10.0000 mg | ORAL_TABLET | Freq: Every evening | ORAL | Status: DC
  Administered 2015-11-20: 10 mg via ORAL
  Filled 2015-11-20: qty 1

## 2015-11-20 MED ORDER — ASPIRIN EC 81 MG PO TBEC
81.0000 mg | DELAYED_RELEASE_TABLET | Freq: Every evening | ORAL | Status: DC
  Administered 2015-11-20: 81 mg via ORAL
  Filled 2015-11-20: qty 1

## 2015-11-20 MED ORDER — ACETAMINOPHEN 325 MG PO TABS: 650.0000 mg | ORAL_TABLET | Freq: Four times a day (QID) | ORAL | Status: DC | PRN

## 2015-11-20 MED ORDER — LEVOTHYROXINE SODIUM 50 MCG PO TABS
50.0000 ug | ORAL_TABLET | Freq: Every day | ORAL | Status: DC
  Administered 2015-11-20: 50 ug via ORAL
  Filled 2015-11-20 (×2): qty 1

## 2015-11-20 MED ORDER — ONDANSETRON HCL 4 MG PO TABS: 4.0000 mg | ORAL_TABLET | Freq: Four times a day (QID) | ORAL | Status: DC | PRN

## 2015-11-20 MED ORDER — DOCUSATE SODIUM 100 MG PO CAPS
100.0000 mg | ORAL_CAPSULE | Freq: Two times a day (BID) | ORAL | Status: DC
  Administered 2015-11-20: 100 mg via ORAL
  Filled 2015-11-20 (×3): qty 1

## 2015-11-20 NOTE — Clinical Social Work Note (Signed)
Clinical Social Work Assessment  Patient Details  Name: Amber Chan MRN: [id number] Date of Birth: 1935-02-13  Date of referral:  11/20/15               Reason for consult:  Discharge Planning, Facility Placement                Permission sought to share information with:  Chartered certified accountant granted to share information::  Yes, Verbal Permission Granted  Name::        Agency::     Relationship::     Contact Information:     Housing/Transportation Living arrangements for the past 2 months:  Single Family Home Source of Information:    Patient Interpreter Needed:    Criminal Activity/Legal Involvement Pertinent to Current Situation/Hospitalization:  No - Comment as needed Significant Relationships:  Spouse Lives with:  Spouse Do you feel safe going back to the place where you live?  No Need for family participation in patient care:  Yes (Comment)  Care giving concerns:  Pt's care cannot be managed at home following hospital d/c.   Social Worker assessment / plan:  Pt hospitalized on 11/19/15 from home with rib fx's due to fall. Pt has dementia and is unable to participate in d/c planning. CSW met with spouse this am to review PT recommendations. SNF placement has been recommended. Spouse is in agreement with this plan. Spouse is aware pt is under observation status and medicare is unable to cover costs of SNF placement. Spouse has requested [facility] for ST SNF placement. Clinicals have been sent to [facility] and SNF has accepted pt when stable for d/c,.  CSW will continue to follow to assist with d/c planning to SNF.  Employment status:  Retired Forensic scientist:  Medicare PT Recommendations:  [place] / Referral to community resources:     Patient/Family's Response to care:  Spouse feels SNF is needed.  Patient/Family's Understanding of and Emotional Response to Diagnosis, Current Treatment, and Prognosis:   Spouse is aware of pt's medical status. " I just can't take care of her like this. I wish I could, but I know I can't. " Support / reassurance provided.  Emotional Assessment Appearance:  Appears stated age Attitude/Demeanor/Rapport:  Other (cooperative) Affect (typically observed):  Calm Orientation:   (disoriented x 4) Alcohol / Substance use:  Not Applicable Psych involvement (Current and /or in the community):  No (Comment)  Discharge Needs  Concerns to be addressed:  Discharge Planning Concerns Readmission within the last 30 days:  No Current discharge risk:  None Barriers to Discharge:  No Barriers Identified   Luretha Rued, [place] 11/20/2015, 3:27 PM

## 2015-11-20 NOTE — Care Management CC44 (Signed)
Condition Code 44 Documentation Completed  Patient Details  Name: Amber Chan MRN: [id number] Date of Birth: 08-24-1934   Condition Code 44 given:  Yes Patient signature on Condition Code 44 notice:  Yes (spouse signed as pt has dementia) Documentation of 2 MD's agreement:  Yes Code 44 added to claim:  Yes    Dellie Catholic, RN 11/20/2015, 12:08 PM

## 2015-11-20 NOTE — Progress Notes (Signed)
PROGRESS NOTE  Amber Chan  [id number] DOB: December 01, 1934 DOA: 11/19/2015 PCP: Alesia Richards, MD Outpatient Specialists:  Subjective: Has advanced dementia, seen with her friend Ruby who is ICU nurse at [facility]. Had a fall at home and broke 2 ribs, admitted for pain control   Brief Narrative:  Amber Chan is a 81 y.o. female with medical history significant of advanced dementia presenting following a fall.  She was accompanied at the time of my evaluation by Bertram Millard, an ICU RN here who is a family friend.  Her husband is her primary caregiver and was sent home to rest.  They are in the process of requesting a hospice consult.  Patient's husband was helping her in bathroom Sunday morning and she fell, striking her left side.  Called family friend Bertram Millard) - patient grimacing with movement, but able to walk into living room.  He declined ER visit but decided today that since her pain was ongoing that he should bring her in.   Recently has gone into adult diapers.  Dementia 3-4 years, rapidly progressing over the last year.  Assessment & Plan:   Principal Problem:   Multiple rib fractures Active Problems:   Labile hypertension   Hypothyroid   SDAT (senile dementia of Alzheimer's type)   L3 vertebral fracture (HCC)   Fractures of left ribs 9-12 and minimally displaced fracture involving left transverse process of L3 -Patient with advanced dementia being cared for at home by elderly husband who had traumatic fractures following a fall on Saturday -Will place in observation status for pain control -Initial pain control with Tylenol and Toradol, low threshold to add morphine if not comfortable -Husband reported to Haldol oral well and he has with her care at home which started to be physically and emotionally demanding. This MRA refill for outpatient's name is  Dementia, advanced -Patient is not oriented at all and minimally conversant -Has family friend who is  an Therapist, sports who will stay at bedside overnight with patient in case of behavioral/sundowning issues to provide respite for patient's husband -Spoke with husband, she is DO NOT RESUSCITATE. Await PT recommendation for placement.  Hypothyroidism -TSH 2.27 on 7/17 -Continue Synthroid at current dosing  HTN -Good control thus far today -Not taking medications    DVT prophylaxis:  Code Status: Full Code Family Communication:  Disposition Plan:  Diet: Diet regular Room service appropriate? Yes; Fluid consistency: Thin  Consultants:   None  Procedures:   None  Antimicrobials:   None   Objective: Vitals:   11/19/15 2233 11/19/15 2339 11/20/15 0010 11/20/15 0706  BP: 125/74 125/72 124/66 (!) 153/74  Pulse: 68 80 83 63  Resp: 18 18 18 18   Temp:   98.3 F (36.8 C) 97.7 F (36.5 C)  TempSrc:   Oral Oral  SpO2: 96% 96% 97% 100%   No intake or output data in the 24 hours ending 11/20/15 1247 There were no vitals filed for this visit.  Examination: General exam: Appears calm and comfortable  Respiratory system: Clear to auscultation. Respiratory effort normal. Cardiovascular system: S1 & S2 heard, RRR. No JVD, murmurs, rubs, gallops or clicks. No pedal edema. Gastrointestinal system: Abdomen is nondistended, soft and nontender. No organomegaly or masses felt. Normal bowel sounds heard. Central nervous system: Alert and oriented. No focal neurological deficits. Extremities: Symmetric 5 x 5 power. Skin: No rashes, lesions or ulcers Psychiatry: Judgement and insight appear normal. Mood & affect appropriate.   Data Reviewed: I have personally reviewed  following labs and imaging studies  CBC:  Recent Labs Lab 11/19/15 1810 11/19/15 1945 11/20/15 0437  WBC  --  7.5 5.7  NEUTROABS  --  5.5  --   HGB 12.2 13.2 13.1  HCT 36.0 38.6 38.5  MCV  --  95.5 95.8  PLT  --  [id number]   Basic Metabolic Panel:  Recent Labs Lab 11/19/15 1810 11/20/15 0437  NA 139 139  K 3.8 4.0    CL 99* 104  CO2  --  29  GLUCOSE 107* 100*  BUN 24* 20  CREATININE 1.10* 0.83  CALCIUM  --  8.7*   GFR: CrCl cannot be calculated (Unknown ideal weight.). Liver Function Tests: No results for input(s): AST, ALT, ALKPHOS, BILITOT, PROT, ALBUMIN in the last 168 hours. No results for input(s): LIPASE, AMYLASE in the last 168 hours. No results for input(s): AMMONIA in the last 168 hours. Coagulation Profile: No results for input(s): INR, PROTIME in the last 168 hours. Cardiac Enzymes: No results for input(s): CKTOTAL, CKMB, CKMBINDEX, TROPONINI in the last 168 hours. BNP (last 3 results) No results for input(s): PROBNP in the last 8760 hours. HbA1C: No results for input(s): HGBA1C in the last 72 hours. CBG: No results for input(s): GLUCAP in the last 168 hours. Lipid Profile: No results for input(s): CHOL, HDL, LDLCALC, TRIG, CHOLHDL, LDLDIRECT in the last 72 hours. Thyroid Function Tests: No results for input(s): TSH, T4TOTAL, FREET4, T3FREE, THYROIDAB in the last 72 hours. Anemia Panel: No results for input(s): VITAMINB12, FOLATE, FERRITIN, TIBC, IRON, RETICCTPCT in the last 72 hours. Urine analysis:    Component Value Date/Time   COLORURINE CANCELED 11/03/2015 1115   APPEARANCEUR CANCELED 11/03/2015 1115   LABSPEC CANCELED 11/03/2015 1115   PHURINE CANCELED 11/03/2015 1115   GLUCOSEU CANCELED 11/03/2015 1115   HGBUR CANCELED 11/03/2015 1115   BILIRUBINUR CANCELED 11/03/2015 1115   KETONESUR CANCELED 11/03/2015 1115   PROTEINUR CANCELED 11/03/2015 1115   UROBILINOGEN 0.2 10/08/2014 0923   NITRITE CANCELED 11/03/2015 1115   LEUKOCYTESUR CANCELED 11/03/2015 1115   Sepsis Labs: @LABRCNTIP (procalcitonin:4,lacticidven:4)  )No results found for this or any previous visit (from the past 240 hour(s)).   Invalid input(s): PROCALCITONIN, LACTICACIDVEN   Radiology Studies: Dg Ribs Unilateral W/chest Left  Result Date: 11/19/2015 CLINICAL DATA:  Fall 3 days ago.  Left upper  rib pain. EXAM: LEFT RIBS AND CHEST - 3+ VIEW COMPARISON:  Two-view chest x-ray 08/28/2013. FINDINGS: The heart is mildly enlarged. Left ninth rib fracture is displaced approximately half the shaft width. More minimally displaced left tenth and eleventh rib fractures are noted. There is no pneumothorax. A left pleural effusion is present. Left basilar airspace disease is noted. Atherosclerotic calcifications are present at the aorta. A remote granuloma is present in the right upper lobe. IMPRESSION: 1. Left ninth, tenth, and eleventh rib fractures. The ninth rib fracture is the most significantly displaced. 2. No pneumothorax. 3. Left pleural effusion and airspace disease worrisome for hemorrhage and contusion. 4. Atherosclerosis. Electronically Signed   By: San Morelle M.D.   On: 11/19/2015 18:29   Dg Pelvis 1-2 Views  Result Date: 11/19/2015 CLINICAL DATA:  81 year old female fell 3 days ago, grimaces with movement. Initial encounter. EXAM: PELVIS - 1-2 VIEW COMPARISON:  None. FINDINGS: Bone mineralization is within normal limits for age. Femoral heads normally located. Hip joint spaces preserved. Pelvis intact. Grossly intact proximal femurs. Abundant bowel gas in the lower abdomen and pelvis but no abnormally dilated loops. IMPRESSION: No acute  osseous abnormality identified about the pelvis. Electronically Signed   By: Genevie Ann M.D.   On: 11/19/2015 18:30   Ct Chest W Contrast  Result Date: 11/19/2015 CLINICAL DATA:  Status post fall. EXAM: CT CHEST, ABDOMEN, AND PELVIS WITH CONTRAST TECHNIQUE: Multidetector CT imaging of the chest, abdomen and pelvis was performed following the standard protocol during bolus administration of intravenous contrast. CONTRAST:  119mL ISOVUE-300 IOPAMIDOL (ISOVUE-300) INJECTION 61% COMPARISON:  CT scan of Aug 25, 2013. FINDINGS: CT CHEST No pneumothorax is noted. Calcified granuloma is noted in right upper lobe. Stable biapical scarring is noted. Minimal left pleural  effusion is noted with adjacent subsegmental atelectasis. Mildly displaced fractures are seen involving the posterior portions of the left eleventh and twelfth ribs. No mediastinal mass or adenopathy is noted. Mild pericardial effusion is noted. There is no evidence of thoracic aortic dissection or aneurysm. CT ABDOMEN AND PELVIS Status post cholecystectomy. Intrahepatic and extrahepatic biliary dilatation is noted most consistent with post cholecystectomy status. The spleen and pancreas are unremarkable. Adrenal glands and kidneys appear normal. No hydronephrosis or renal obstruction is noted. There is no evidence of abdominal aortic aneurysm or dissection. There is no evidence of bowel obstruction. Urinary bladder is unremarkable. Uterus and ovaries are unremarkable. No abnormal fluid collection is noted. Minimally displaced fracture is seen involving the left transverse process of L3. IMPRESSION: Mildly displaced left eleventh and twelfth rib fractures. Minimal left pleural effusion is noted with adjacent subsegmental atelectasis. Intrahepatic and extrahepatic biliary dilatation is noted most consistent with post cholecystectomy status. Minimally displaced fracture involving left transverse process of L3. No other abnormality seen in the abdomen or pelvis. Electronically Signed   By: Marijo Conception, M.D.   On: 11/19/2015 20:46   Ct Abdomen Pelvis W Contrast  Result Date: 11/19/2015 CLINICAL DATA:  Status post fall. EXAM: CT CHEST, ABDOMEN, AND PELVIS WITH CONTRAST TECHNIQUE: Multidetector CT imaging of the chest, abdomen and pelvis was performed following the standard protocol during bolus administration of intravenous contrast. CONTRAST:  131mL ISOVUE-300 IOPAMIDOL (ISOVUE-300) INJECTION 61% COMPARISON:  CT scan of Aug 25, 2013. FINDINGS: CT CHEST No pneumothorax is noted. Calcified granuloma is noted in right upper lobe. Stable biapical scarring is noted. Minimal left pleural effusion is noted with adjacent  subsegmental atelectasis. Mildly displaced fractures are seen involving the posterior portions of the left eleventh and twelfth ribs. No mediastinal mass or adenopathy is noted. Mild pericardial effusion is noted. There is no evidence of thoracic aortic dissection or aneurysm. CT ABDOMEN AND PELVIS Status post cholecystectomy. Intrahepatic and extrahepatic biliary dilatation is noted most consistent with post cholecystectomy status. The spleen and pancreas are unremarkable. Adrenal glands and kidneys appear normal. No hydronephrosis or renal obstruction is noted. There is no evidence of abdominal aortic aneurysm or dissection. There is no evidence of bowel obstruction. Urinary bladder is unremarkable. Uterus and ovaries are unremarkable. No abnormal fluid collection is noted. Minimally displaced fracture is seen involving the left transverse process of L3. IMPRESSION: Mildly displaced left eleventh and twelfth rib fractures. Minimal left pleural effusion is noted with adjacent subsegmental atelectasis. Intrahepatic and extrahepatic biliary dilatation is noted most consistent with post cholecystectomy status. Minimally displaced fracture involving left transverse process of L3. No other abnormality seen in the abdomen or pelvis. Electronically Signed   By: Marijo Conception, M.D.   On: 11/19/2015 20:46        Scheduled Meds: . aspirin EC  81 mg Oral QPM  . citalopram  10 mg Oral QPM  . docusate sodium  100 mg Oral BID  . enoxaparin (LOVENOX) injection  40 mg Subcutaneous Q24H  . levothyroxine  50 mcg Oral QAC breakfast   Continuous Infusions:    LOS: 1 day    Time spent: 35 minutes    Clea Dubach A, MD [facility] Pager [phone]  If 7PM-7AM, please contact night-coverage [email or web address] Password [facility] 11/20/2015, 12:47 PM

## 2015-11-20 NOTE — Clinical Social Work Placement (Signed)
   CLINICAL SOCIAL WORK PLACEMENT  NOTE  Date:  11/20/2015  Patient Details  Name: Amber Chan MRN: [id number] Date of Birth: 03-06-1935  Clinical Social Work is seeking post-discharge placement for this patient at the [place] level of care (*CSW will initial, date and re-position this form in  chart as items are completed):  Yes   Patient/family provided with [facility] Work Department's list of facilities offering this level of care within the geographic area requested by the patient (or if unable, by the patient's family).  Yes   Patient/family informed of their freedom to choose among providers that offer the needed level of care, that participate in Medicare, Medicaid or managed care program needed by the patient, have an available bed and are willing to accept the patient.  Yes   Patient/family informed of [facility]'s ownership interest in [facility] and [facility], as well as of the fact that they are under no obligation to receive care at these facilities.  PASRR submitted to EDS on 11/20/15     PASRR number received on       Existing PASRR number confirmed on       FL2 transmitted to all facilities in geographic area requested by pt/family on       FL2 transmitted to all facilities within larger geographic area on       Patient informed that his/her managed care company has contracts with or will negotiate with certain facilities, including the following:        Yes   Patient/family informed of bed offers received.  Patient chooses bed at [facility]     Physician recommends and patient chooses bed at      Patient to be transferred to   on  .  Patient to be transferred to facility by       Patient family notified on   of transfer.  Name of family member notified:        PHYSICIAN       Additional Comment:    _______________________________________________ Luretha Rued, [place] 11/20/2015,  3:36 PM

## 2015-11-20 NOTE — NC FL2 (Signed)
[place] MEDICAID FL2 LEVEL OF CARE SCREENING TOOL     IDENTIFICATION  Patient Name: Amber Chan Birthdate: 26-May-1934 Sex: female Admission Date (Current Location): 11/19/2015  [facility] and [place] Number:  Herbalist and Address:  [facility],  [place] [address] [place]      Provider Number: [id number]  Attending Physician Name and Address:  Verlee Monte, MD  Relative Name and Phone Number:       Current Level of Care: Hospital Recommended Level of Care: [place] Prior Approval Number:    Date Approved/Denied:   PASRR Number:    Discharge Plan: SNF    Current Diagnoses: Patient Active Problem List   Diagnosis Date Noted  . L3 vertebral fracture ([place]) 11/20/2015  . Multiple rib fractures 11/19/2015  . BMI less than 19,adult 03/19/2015  . Atrial flutter ([place]) 09/27/2013  . Pericardial effusion 09/27/2013  . Vitamin D deficiency 08/13/2013  . Prediabetes 08/13/2013  . Hyperlipidemia 08/13/2013  . Medication management 08/13/2013  . SDAT (senile dementia of Alzheimer's type) 08/13/2013  . Labile hypertension   . Hypothyroid   . IBS (irritable bowel syndrome)   . Breast cancer ([place])     Orientation RESPIRATION BLADDER Height & Weight      (disoriented x 4)  Normal Incontinent Weight:   Height:     BEHAVIORAL SYMPTOMS/MOOD NEUROLOGICAL BOWEL NUTRITION STATUS  Other (Comment) (no behaviors)   Incontinent Diet  AMBULATORY STATUS COMMUNICATION OF NEEDS Skin   Extensive Assist  (Answers with one word, can be difficult to understand) Normal                       Personal Care Assistance Level of Assistance  Bathing, Feeding, Dressing Bathing Assistance: Maximum assistance Feeding assistance: Limited assistance Dressing Assistance: Maximum assistance     Functional Limitations Info  Sight, Hearing, Speech Sight Info: Adequate Hearing Info: Adequate Speech Info: Adequate    SPECIAL CARE FACTORS  FREQUENCY  PT (By licensed PT)     PT Frequency: 5 x wk              Contractures Contractures Info: Not present    Additional Factors Info  Code Status Code Status Info: Full Code             Current Medications (11/20/2015):  This is the current hospital active medication list Current Facility-Administered Medications  Medication Dose Route Frequency Provider Last Rate Last Dose  . acetaminophen (TYLENOL) tablet 650 mg  650 mg Oral Q6H PRN Karmen Bongo, MD       Or  . acetaminophen (TYLENOL) suppository 650 mg  650 mg Rectal Q6H PRN Karmen Bongo, MD      . aspirin EC tablet 81 mg  81 mg Oral QPM Karmen Bongo, MD      . citalopram (CELEXA) tablet 10 mg  10 mg Oral QPM Karmen Bongo, MD      . docusate sodium (COLACE) capsule 100 mg  100 mg Oral BID Karmen Bongo, MD      . enoxaparin (LOVENOX) injection 40 mg  40 mg Subcutaneous Q24H Karmen Bongo, MD      . ketorolac (TORADOL) 15 MG/ML injection 15 mg  15 mg Intravenous Q6H PRN Karmen Bongo, MD   15 mg at 11/20/15 0926  . levothyroxine (SYNTHROID, LEVOTHROID) tablet 50 mcg  50 mcg Oral QAC breakfast Karmen Bongo, MD   50 mcg at 11/20/15 0817  . ondansetron (ZOFRAN)  tablet 4 mg  4 mg Oral Q6H PRN Karmen Bongo, MD       Or  . ondansetron [facility]) injection 4 mg  4 mg Intravenous Q6H PRN Karmen Bongo, MD         Discharge Medications: Please see discharge summary for a list of discharge medications.  Relevant Imaging Results:  Relevant Lab Results:   Additional Information ss#  [id number]  [name], Randall An, LCSW

## 2015-11-20 NOTE — Care Management Obs Status (Signed)
[place] NOTIFICATION   Patient Details  Name: Amber Chan MRN: [id number] Date of Birth: 04-21-1934   Medicare Observation Status Notification Given:  Yes    Dellie Catholic, RN 11/20/2015, 12:05 PM

## 2015-11-20 NOTE — Care Management Note (Addendum)
Case Management Note  Patient Details  Name: BETHLEHEM LANGSTAFF MRN: [id number] Date of Birth: 1935-01-05  Subjective/Objective:                  Multiple Rib Fx Action/Plan: Discharge planning Expected Discharge Date: 11/20/15                 Expected Discharge Plan:  [place]  In-House Referral:     Discharge planning Services  CM Consult  Post Acute Care Choice:    Choice offered to:  Spouse, Patient  DME Arranged:    DME Agency:     HH Arranged:    [place] Agency:     Status of Service:  Completed, signed off  If discussed at [employer] of Stay Meetings, dates discussed:    Additional Comments: Cm met with pt, pt's spouse, Dorothyann Peng [phone], and friend Dennison Mascot in room. Pt has dementia. Per HIPAA, CM asked Dorothyann Peng if he wished for Ruby to remain in room for discussion of pt's disposition and Dorothyann Peng verbalized desire to have Ruby in room for discussion with CM and CSW (will relay this permission to CSW as Bertram Millard is advocating for pt and Dorothyann Peng wishes her to be present).  Dorothyann Peng verbalized understanding of home health options, private duty options and cost and has chosen to explore SNF private pay.  Cm has contacted CSW, Roselyn Reef to arrange.  No other CM needs were communicated. Dellie Catholic, RN 11/20/2015, 12:23 PM

## 2015-11-20 NOTE — Evaluation (Signed)
Physical Therapy Evaluation Patient Details Name: Amber Chan MRN: [id number] DOB: 03-11-1935 Today's Date: 11/20/2015   History of Present Illness  81 yo female with history of dementia who fell 7/30 at home when spouse assisting her. she sustained left ribs 9,10, 11 fractures. per family/friends, a steady declione in function.   Clinical Impression  The patient was assisted to the recliner with 2 person assist. She was not resistent, demonstrates  posterior lean. The spouse is unable to care for patient at this level. Pt admitted with above diagnosis. Pt currently with functional limitations due to the deficits listed below (see PT Problem List). Pt will benefit from skilled PT to increase their independence and safety with mobility to allow discharge to the venue listed below.       Follow Up Recommendations SNF;Supervision/Assistance - 24 hour    Equipment Recommendations  None recommended by PT    Recommendations for Other Services       Precautions / Restrictions Precautions Precautions: Fall Precaution Comments: L rib fxs      Mobility  Bed Mobility Overal bed mobility: + 2 for safety/equipment;+2 for physical assistance;Needs Assistance Bed Mobility: Supine to Sit     Supine to sit: Total assist;+2 for physical assistance;+2 for safety/equipment;HOB elevated     General bed mobility comments: use  of bed pad, leans backward  Transfers Overall transfer level: Needs assistance Equipment used: 2 person hand held assist Transfers: Sit to/from Stand;Stand Pivot Transfers Sit to Stand: +2 physical assistance;+2 safety/equipment;Total assist Stand pivot transfers: +2 physical assistance;+2 safety/equipment;Total assist       General transfer comment: one person on each arm and side, patient stood but leaning heavily posteriorly, assist at the trunk to  attempt more upright posture.  Ambulation/Gait                Stairs            Wheelchair  Mobility    Modified Rankin (Stroke Patients Only)       Balance Overall balance assessment: History of Falls;Needs assistance Sitting-balance support: Feet supported;Bilateral upper extremity supported Sitting balance-Leahy Scale: Zero     Standing balance support: During functional activity;Bilateral upper extremity supported Standing balance-Leahy Scale: Zero                               Pertinent Vitals/Pain Pain Assessment: Faces Faces Pain Scale: Hurts even more Pain Location: not indicated Pain Descriptors / Indicators: Discomfort;Grimacing;Guarding Pain Intervention(s): Premedicated before session    Home Living Family/patient expects to be discharged to:: Skilled nursing facility Living Arrangements: Spouse/significant other             Home Equipment: Walker - 2 wheels      Prior Function Level of Independence: Needs assistance   Gait / Transfers Assistance Needed: spouse held both arms and walked backwards to allow patient to walk forwards. incontinent, uses depends           Hand Dominance        Extremity/Trunk Assessment   Upper Extremity Assessment: Generalized weakness           Lower Extremity Assessment: Generalized weakness         Communication      Cognition Arousal/Alertness: Awake/alert Behavior During Therapy: Flat affect Overall Cognitive Status: History of cognitive impairments - at baseline  General Comments      Exercises        Assessment/Plan    PT Assessment Patient needs continued PT services  PT Diagnosis Difficulty walking;Generalized weakness;Abnormality of gait;Acute pain;Altered mental status   PT Problem List Decreased strength;Decreased activity tolerance;Decreased balance;Decreased mobility;Decreased cognition  PT Treatment Interventions Gait training;Functional mobility training;Therapeutic activities;Patient/family education   PT Goals (Current goals  can be found in the Care Plan section) Acute Rehab PT Goals Patient Stated Goal: per spouse, to go to a facility, hope to walk again PT Goal Formulation: With family Time For Goal Achievement: 12/04/15 Potential to Achieve Goals: Fair    Frequency Min 3X/week   Barriers to discharge Decreased caregiver support      Co-evaluation               End of Session   Activity Tolerance: Patient limited by pain Patient left: in chair;with call bell/phone within reach;with family/visitor present Nurse Communication: Mobility status    Functional Assessment Tool Used: clinical judgement Functional Limitation: Mobility: Walking and moving around Mobility: Walking and Moving Around Current Status [id number]): 100 percent impaired, limited or restricted Mobility: Walking and Moving Around Goal Status [id number]): At least 20 percent but less than 40 percent impaired, limited or restricted    Time: 1143-1153 PT Time Calculation (min) (ACUTE ONLY): 10 min   Charges:   PT Evaluation $PT Eval Low Complexity: 1 Procedure     PT G Codes:   PT G-Codes **NOT FOR INPATIENT CLASS** Functional Assessment Tool Used: clinical judgement Functional Limitation: Mobility: Walking and moving around Mobility: Walking and Moving Around Current Status [id number]): 100 percent impaired, limited or restricted Mobility: Walking and Moving Around Goal Status [id number]): At least 20 percent but less than 40 percent impaired, limited or restricted    Claretha Cooper 11/20/2015, 12:54 PM Tresa Endo PT [phone]

## 2015-11-21 DIAGNOSIS — E039 Hypothyroidism, unspecified: Secondary | ICD-10-CM | POA: Diagnosis not present

## 2015-11-21 DIAGNOSIS — R278 Other lack of coordination: Secondary | ICD-10-CM | POA: Diagnosis not present

## 2015-11-21 DIAGNOSIS — S2242XA Multiple fractures of ribs, left side, initial encounter for closed fracture: Secondary | ICD-10-CM | POA: Diagnosis not present

## 2015-11-21 DIAGNOSIS — M6281 Muscle weakness (generalized): Secondary | ICD-10-CM | POA: Diagnosis not present

## 2015-11-21 DIAGNOSIS — S2239XA Fracture of one rib, unspecified side, initial encounter for closed fracture: Secondary | ICD-10-CM | POA: Diagnosis not present

## 2015-11-21 DIAGNOSIS — I10 Essential (primary) hypertension: Secondary | ICD-10-CM | POA: Diagnosis not present

## 2015-11-21 DIAGNOSIS — S32038A Other fracture of third lumbar vertebra, initial encounter for closed fracture: Secondary | ICD-10-CM | POA: Diagnosis not present

## 2015-11-21 MED ORDER — HYDROCODONE-ACETAMINOPHEN 5-325 MG PO TABS: 1.0000 | ORAL_TABLET | Freq: Four times a day (QID) | ORAL | 0 refills | Status: DC | PRN

## 2015-11-21 NOTE — Discharge Summary (Signed)
Physician Discharge Summary  KEEYANA Chan [id number] DOB: Sep 29, 1934 DOA: 11/19/2015  PCP: Alesia Richards, MD  Admit date: 11/19/2015 Discharge date: 11/21/2015  Admitted From: Home Disposition:  [facility]  Recommendations for Outpatient Follow-up:  1. Follow up with PCP in 1-2 weeks  Home Health: NA Equipment/Devices: NA  Discharge Condition: Stable CODE STATUS: DNR/DNI Diet recommendation: Heart Healthy  Brief/Interim Summary: Amber Chan is a 81 y.o. female with medical history significant of advanced dementia presenting following a fall.  She was accompanied at the time of my evaluation by Bertram Millard, an ICU RN here who is a family friend.  Her husband is her primary caregiver and was sent home to rest.  They are in the process of requesting a hospice consult.  Patient's husband was helping her in bathroom Sunday morning and she fell, striking her left side.  Called family friend Bertram Millard) - patient grimacing with movement, but able to walk into living room.  He declined ER visit but decided today that since her pain was ongoing that he should bring her in.   Recently has gone into adult diapers.  Dementia 3-4 years, rapidly progressing over the last year.  Discharge Diagnoses:  Principal Problem:   Multiple rib fractures Active Problems:   Labile hypertension   Hypothyroid   SDAT (senile dementia of Alzheimer's type)   L3 vertebral fracture (HCC)   Fractures of left ribs 9-12 and minimally displaced fracture involving left transverse process of L3 -Patient with advanced dementia being cared for at home by elderly husband who had traumatic fractures following a fall on Saturday -Will place in observation status for pain control -CXR multiple left rib fractures, CT scan showing no internal injuries, minimal fluids likely from the fractures. -Patient husband voiced concerns about she cannot take care of her at home. -PT recommended SNF, patient will be going to  [facility]. -Vicodin prescribed as needed, her pain appears to be control with only Toradol and Tylenol in the hospital.  Dementia, advanced -Patient is not oriented at all and minimally conversant -Has family friend who is an Therapist, sports who will stay at bedside overnight with patient in case of behavioral/sundowning issues to provide respite for patient's husband -Spoke with husband at bedside, according to her living will she wants to be DNR/DNI.  Hypothyroidism -TSH 2.27 on 7/17 -Continue Synthroid at current dosing  HTN -Good control thus far today -Not taking medications  CODE STATUS -According to my discussion with patient's husband he reports she has a living well asked for DNR/DNI.  Discharge Instructions  Discharge Instructions    Diet - low sodium heart healthy    Complete by:  As directed   Increase activity slowly    Complete by:  As directed       Medication List    TAKE these medications   aspirin EC 81 MG tablet Take 81 mg by mouth every evening.   cholecalciferol 1000 units tablet Commonly known as:  VITAMIN D Take 2,000 Units by mouth 2 (two) times daily.   citalopram 10 MG tablet Commonly known as:  CELEXA Take 10 mg by mouth every evening.   HYDROcodone-acetaminophen 5-325 MG tablet Commonly known as:  NORCO/VICODIN Take 1 tablet by mouth every 6 (six) hours as needed for moderate pain.   levothyroxine 50 MCG tablet Commonly known as:  SYNTHROID, LEVOTHROID Take 50 mcg by mouth daily before breakfast.   oyster calcium 500 MG Tabs tablet Take 500 mg of elemental calcium by mouth  every evening.      Contact information for after-discharge care    Destination    [facility] .   Specialty:  [place] information: [place] [phone]             No Known Allergies  Consultations:  None   Procedures/Studies: Dg Ribs Unilateral W/chest Left  Result Date:  11/19/2015 CLINICAL DATA:  Fall 3 days ago.  Left upper rib pain. EXAM: LEFT RIBS AND CHEST - 3+ VIEW COMPARISON:  Two-view chest x-ray 08/28/2013. FINDINGS: The heart is mildly enlarged. Left ninth rib fracture is displaced approximately half the shaft width. More minimally displaced left tenth and eleventh rib fractures are noted. There is no pneumothorax. A left pleural effusion is present. Left basilar airspace disease is noted. Atherosclerotic calcifications are present at the aorta. A remote granuloma is present in the right upper lobe. IMPRESSION: 1. Left ninth, tenth, and eleventh rib fractures. The ninth rib fracture is the most significantly displaced. 2. No pneumothorax. 3. Left pleural effusion and airspace disease worrisome for hemorrhage and contusion. 4. Atherosclerosis. Electronically Signed   By: San Morelle M.D.   On: 11/19/2015 18:29   Dg Pelvis 1-2 Views  Result Date: 11/19/2015 CLINICAL DATA:  81 year old female fell 3 days ago, grimaces with movement. Initial encounter. EXAM: PELVIS - 1-2 VIEW COMPARISON:  None. FINDINGS: Bone mineralization is within normal limits for age. Femoral heads normally located. Hip joint spaces preserved. Pelvis intact. Grossly intact proximal femurs. Abundant bowel gas in the lower abdomen and pelvis but no abnormally dilated loops. IMPRESSION: No acute osseous abnormality identified about the pelvis. Electronically Signed   By: Genevie Ann M.D.   On: 11/19/2015 18:30   Ct Chest W Contrast  Result Date: 11/19/2015 CLINICAL DATA:  Status post fall. EXAM: CT CHEST, ABDOMEN, AND PELVIS WITH CONTRAST TECHNIQUE: Multidetector CT imaging of the chest, abdomen and pelvis was performed following the standard protocol during bolus administration of intravenous contrast. CONTRAST:  116mL ISOVUE-300 IOPAMIDOL (ISOVUE-300) INJECTION 61% COMPARISON:  CT scan of Aug 25, 2013. FINDINGS: CT CHEST No pneumothorax is noted. Calcified granuloma is noted in right upper lobe.  Stable biapical scarring is noted. Minimal left pleural effusion is noted with adjacent subsegmental atelectasis. Mildly displaced fractures are seen involving the posterior portions of the left eleventh and twelfth ribs. No mediastinal mass or adenopathy is noted. Mild pericardial effusion is noted. There is no evidence of thoracic aortic dissection or aneurysm. CT ABDOMEN AND PELVIS Status post cholecystectomy. Intrahepatic and extrahepatic biliary dilatation is noted most consistent with post cholecystectomy status. The spleen and pancreas are unremarkable. Adrenal glands and kidneys appear normal. No hydronephrosis or renal obstruction is noted. There is no evidence of abdominal aortic aneurysm or dissection. There is no evidence of bowel obstruction. Urinary bladder is unremarkable. Uterus and ovaries are unremarkable. No abnormal fluid collection is noted. Minimally displaced fracture is seen involving the left transverse process of L3. IMPRESSION: Mildly displaced left eleventh and twelfth rib fractures. Minimal left pleural effusion is noted with adjacent subsegmental atelectasis. Intrahepatic and extrahepatic biliary dilatation is noted most consistent with post cholecystectomy status. Minimally displaced fracture involving left transverse process of L3. No other abnormality seen in the abdomen or pelvis. Electronically Signed   By: Marijo Conception, M.D.   On: 11/19/2015 20:46   Ct Abdomen Pelvis W Contrast  Result Date: 11/19/2015 CLINICAL DATA:  Status post fall. EXAM: CT CHEST, ABDOMEN,  AND PELVIS WITH CONTRAST TECHNIQUE: Multidetector CT imaging of the chest, abdomen and pelvis was performed following the standard protocol during bolus administration of intravenous contrast. CONTRAST:  139mL ISOVUE-300 IOPAMIDOL (ISOVUE-300) INJECTION 61% COMPARISON:  CT scan of Aug 25, 2013. FINDINGS: CT CHEST No pneumothorax is noted. Calcified granuloma is noted in right upper lobe. Stable biapical scarring is  noted. Minimal left pleural effusion is noted with adjacent subsegmental atelectasis. Mildly displaced fractures are seen involving the posterior portions of the left eleventh and twelfth ribs. No mediastinal mass or adenopathy is noted. Mild pericardial effusion is noted. There is no evidence of thoracic aortic dissection or aneurysm. CT ABDOMEN AND PELVIS Status post cholecystectomy. Intrahepatic and extrahepatic biliary dilatation is noted most consistent with post cholecystectomy status. The spleen and pancreas are unremarkable. Adrenal glands and kidneys appear normal. No hydronephrosis or renal obstruction is noted. There is no evidence of abdominal aortic aneurysm or dissection. There is no evidence of bowel obstruction. Urinary bladder is unremarkable. Uterus and ovaries are unremarkable. No abnormal fluid collection is noted. Minimally displaced fracture is seen involving the left transverse process of L3. IMPRESSION: Mildly displaced left eleventh and twelfth rib fractures. Minimal left pleural effusion is noted with adjacent subsegmental atelectasis. Intrahepatic and extrahepatic biliary dilatation is noted most consistent with post cholecystectomy status. Minimally displaced fracture involving left transverse process of L3. No other abnormality seen in the abdomen or pelvis. Electronically Signed   By: Marijo Conception, M.D.   On: 11/19/2015 20:46    (Echo, Carotid, EGD, Colonoscopy, ERCP)    Subjective:   Discharge Exam: Vitals:   11/20/15 2147 11/21/15 0556  BP: 138/69 130/61  Pulse: 71 72  Resp: 16 16  Temp: 98.9 F (37.2 C) 98.8 F (37.1 C)   Vitals:   11/20/15 0706 11/20/15 1315 11/20/15 2147 11/21/15 0556  BP: (!) 153/74 120/70 138/69 130/61  Pulse: 63 69 71 72  Resp: 18 17 16 16   Temp: 97.7 F (36.5 C) 99.5 F (37.5 C) 98.9 F (37.2 C) 98.8 F (37.1 C)  TempSrc: Oral Oral Other (Comment) Oral  SpO2: 100% 97% 96% 96%    General: Pt is alert, awake, not in acute  distress Cardiovascular: RRR, S1/S2 +, no rubs, no gallops Respiratory: CTA bilaterally, no wheezing, no rhonchi Abdominal: Soft, NT, ND, bowel sounds + Extremities: no edema, no cyanosis    The results of significant diagnostics from this hospitalization (including imaging, microbiology, ancillary and laboratory) are listed below for reference.     Microbiology: No results found for this or any previous visit (from the past 240 hour(s)).   Labs: BNP (last 3 results) No results for input(s): BNP in the last 8760 hours. Basic Metabolic Panel:  Recent Labs Lab 11/19/15 1810 11/20/15 0437  NA 139 139  K 3.8 4.0  CL 99* 104  CO2  --  29  GLUCOSE 107* 100*  BUN 24* 20  CREATININE 1.10* 0.83  CALCIUM  --  8.7*   Liver Function Tests: No results for input(s): AST, ALT, ALKPHOS, BILITOT, PROT, ALBUMIN in the last 168 hours. No results for input(s): LIPASE, AMYLASE in the last 168 hours. No results for input(s): AMMONIA in the last 168 hours. CBC:  Recent Labs Lab 11/19/15 1810 11/19/15 1945 11/20/15 0437  WBC  --  7.5 5.7  NEUTROABS  --  5.5  --   HGB 12.2 13.2 13.1  HCT 36.0 38.6 38.5  MCV  --  95.5 95.8  PLT  --  259 265   Cardiac Enzymes: No results for input(s): CKTOTAL, CKMB, CKMBINDEX, TROPONINI in the last 168 hours. BNP: Invalid input(s): POCBNP CBG: No results for input(s): GLUCAP in the last 168 hours. D-Dimer No results for input(s): DDIMER in the last 72 hours. Hgb A1c No results for input(s): HGBA1C in the last 72 hours. Lipid Profile No results for input(s): CHOL, HDL, LDLCALC, TRIG, CHOLHDL, LDLDIRECT in the last 72 hours. Thyroid function studies No results for input(s): TSH, T4TOTAL, T3FREE, THYROIDAB in the last 72 hours.  Invalid input(s): FREET3 Anemia work up No results for input(s): VITAMINB12, FOLATE, FERRITIN, TIBC, IRON, RETICCTPCT in the last 72 hours. Urinalysis    Component Value Date/Time   COLORURINE CANCELED 11/03/2015 1115    APPEARANCEUR CANCELED 11/03/2015 1115   LABSPEC CANCELED 11/03/2015 1115   PHURINE CANCELED 11/03/2015 1115   GLUCOSEU CANCELED 11/03/2015 1115   HGBUR CANCELED 11/03/2015 1115   BILIRUBINUR CANCELED 11/03/2015 1115   KETONESUR CANCELED 11/03/2015 1115   PROTEINUR CANCELED 11/03/2015 1115   UROBILINOGEN 0.2 10/08/2014 0923   NITRITE CANCELED 11/03/2015 1115   LEUKOCYTESUR CANCELED 11/03/2015 1115   Sepsis Labs Invalid input(s): PROCALCITONIN,  WBC,  LACTICIDVEN Microbiology No results found for this or any previous visit (from the past 240 hour(s)).   Time coordinating discharge: Over 30 minutes  SIGNED:   Birdie Hopes, MD  [facility] 11/21/2015, 12:04 PM Pager   If 7PM-7AM, please contact night-coverage [email or web address] Password [email or web address]

## 2015-11-21 NOTE — Clinical Social Work Placement (Signed)
   CLINICAL SOCIAL WORK PLACEMENT  NOTE  Date:  11/21/2015  Patient Details  Name: Amber Chan MRN: [id number] Date of Birth: 04/11/1935  Clinical Social Work is seeking post-discharge placement for this patient at the [place] level of care (*CSW will initial, date and re-position this form in  chart as items are completed):  Yes   Patient/family provided with [place] Work Department's list of facilities offering this level of care within the geographic area requested by the patient (or if unable, by the patient's family).  Yes   Patient/family informed of their freedom to choose among providers that offer the needed level of care, that participate in Medicare, Medicaid or managed care program needed by the patient, have an available bed and are willing to accept the patient.  Yes   Patient/family informed of [facility]'s ownership interest in [facility] and [facility], as well as of the fact that they are under no obligation to receive care at these facilities.  PASRR submitted to EDS on 11/20/15     PASRR number received on 11/20/15     Existing PASRR number confirmed on       FL2 transmitted to all facilities in geographic area requested by pt/family on 11/20/15     FL2 transmitted to all facilities within larger geographic area on       Patient informed that his/her managed care company has contracts with or will negotiate with certain facilities, including the following:        Yes   Patient/family informed of bed offers received.  Patient chooses bed at [facility]     Physician recommends and patient chooses bed at      Patient to be transferred to [facility] on 11/21/15.  Patient to be transferred to facility by [facility]     Patient family notified on 11/21/15 of transfer.  Name of family member notified:  SPOUSE     PHYSICIAN       Additional Comment: Pt / spouse are in agreement with d/c to [facility]  today. [facility] transport is required. Medical necessity for completed. Spouse is aware out of pocket costs may be associated with [facility] transport. D/C Summary sent to SNF for review. Scripts included in d/c packet. # for report provided to nsg.   _______________________________________________ Luretha Rued, [place]  [phone] 11/21/2015, 2:50 PM

## 2015-11-21 NOTE — Progress Notes (Signed)
Report called to Tonya at [facility]. 

## 2015-11-24 ENCOUNTER — Non-Acute Institutional Stay (SKILLED_NURSING_FACILITY): Payer: Medicare Other | Admitting: Adult Health

## 2015-11-24 ENCOUNTER — Encounter: Payer: Self-pay | Admitting: Adult Health

## 2015-11-24 DIAGNOSIS — I10 Essential (primary) hypertension: Secondary | ICD-10-CM

## 2015-11-24 DIAGNOSIS — R278 Other lack of coordination: Secondary | ICD-10-CM | POA: Diagnosis not present

## 2015-11-24 DIAGNOSIS — E039 Hypothyroidism, unspecified: Secondary | ICD-10-CM | POA: Diagnosis not present

## 2015-11-24 DIAGNOSIS — G301 Alzheimer's disease with late onset: Secondary | ICD-10-CM

## 2015-11-24 DIAGNOSIS — E559 Vitamin D deficiency, unspecified: Secondary | ICD-10-CM | POA: Diagnosis not present

## 2015-11-24 DIAGNOSIS — S32038S Other fracture of third lumbar vertebra, sequela: Secondary | ICD-10-CM | POA: Diagnosis not present

## 2015-11-24 DIAGNOSIS — F0280 Dementia in other diseases classified elsewhere without behavioral disturbance: Secondary | ICD-10-CM

## 2015-11-24 DIAGNOSIS — G308 Other Alzheimer's disease: Secondary | ICD-10-CM | POA: Diagnosis not present

## 2015-11-24 DIAGNOSIS — R5381 Other malaise: Secondary | ICD-10-CM | POA: Diagnosis not present

## 2015-11-24 DIAGNOSIS — S2242XS Multiple fractures of ribs, left side, sequela: Secondary | ICD-10-CM | POA: Diagnosis not present

## 2015-11-24 DIAGNOSIS — M6281 Muscle weakness (generalized): Secondary | ICD-10-CM | POA: Diagnosis not present

## 2015-11-24 DIAGNOSIS — R0989 Other specified symptoms and signs involving the circulatory and respiratory systems: Secondary | ICD-10-CM

## 2015-11-24 NOTE — Progress Notes (Signed)
Patient ID: Amber Chan, female   DOB: February 16, 1935, 81 y.o.   MRN: [id number]    DATE:  11/24/2015   MRN:  [id number]  BIRTHDAY: 10-Apr-1935  Facility:  [facility]ing Home Location:  [place] [facility] [place] Room Number: [id number]  LEVEL OF CARE:  SNF [phone])  Contact Information    Name Relation Home Work Mobile   Mcintyre,Stanley Spouse Lockhart [phone]  [phone]   [place] [phone]  [phone]       Code Status History    Date Active Date Inactive Code Status Order ID Comments User Context   11/21/2015 12:10 PM 11/21/2015  4:49 PM DNR [id number]  Verlee Monte, MD Inpatient   11/20/2015 12:05 AM 11/21/2015 12:10 PM Full Code [id number]  Karmen Bongo, MD Inpatient   08/28/2013  2:46 PM 08/29/2013  2:19 PM Full Code [id number]  Evans Lance, MD Inpatient   08/26/2013  1:55 AM 08/28/2013  2:46 PM Full Code [id number]  Phillips Grout, MD Inpatient    Questions for Most Recent Historical Code Status (Order [id number])    Question Answer Comment   In the event of cardiac or respiratory ARREST Do not call a "code blue"    In the event of cardiac or respiratory ARREST Do not perform Intubation, CPR, defibrillation or ACLS    In the event of cardiac or respiratory ARREST Use medication by any route, position, wound care, and other measures to relive pain and suffering. May use oxygen, suction and manual treatment of airway obstruction as needed for comfort.         Advance Directive Documentation   Flowsheet Row Most Recent Value  Type of Advance Directive  Out of facility DNR (pink MOST or yellow form)  Pre-existing out of facility DNR order (yellow form or pink MOST form)  No data  "MOST" Form in Place?  No data       Chief Complaint  Patient presents with  . Hospitalization Follow-up    HISTORY OF PRESENT ILLNESS:  This is a 81 year old female who has been admitted to [facility] on 11/21/15 from [facility].  She has PMH of advanced dementia. She fell at home striking her left side sus ( left Ribs 9-12 and minimally displaced fracture involving left transverse process of L3 taining multiple rib fractures). CT scan showing no internal injuries. Her husband is her primary caregiver and voiced concerns about being not able to take care of her at home.  She has been admitted for a short-term rehabilitation.  PAST MEDICAL HISTORY:  Past Medical History:  Diagnosis Date  . Atrial flutter ([place])   . Breast cancer ([place]) 1970   Right  . Dementia    moderate  . Hypothyroid   . IBS (irritable bowel syndrome)   . Labile hypertension   . Vitamin D deficiency      CURRENT MEDICATIONS: Reviewed  Patient's Medications  New Prescriptions   No medications on file  Previous Medications   ASPIRIN EC 81 MG TABLET    Take 81 mg by mouth every evening.   CHOLECALCIFEROL (VITAMIN D) 1000 UNITS TABLET    Take 2,000 Units by mouth 2 (two) times daily.   CITALOPRAM (CELEXA) 10 MG TABLET    Take 10 mg by mouth every evening.    HYDROCODONE-ACETAMINOPHEN (NORCO/VICODIN) 5-325 MG TABLET    Take 1 tablet by mouth every 6 (six) hours as needed  for moderate pain.   LEVOTHYROXINE (SYNTHROID, LEVOTHROID) 50 MCG TABLET    Take 50 mcg by mouth daily before breakfast.    OYSTER SHELL (OYSTER CALCIUM) 500 MG TABS TABLET    Take 500 mg of elemental calcium by mouth every evening.  Modified Medications   No medications on file  Discontinued Medications   No medications on file     No Known Allergies   REVIEW OF SYSTEMS:  Unable to obtain due to advanced dementia    PHYSICAL EXAMINATION  GENERAL APPEARANCE:  In no acute distress.  SKIN:  Has bruising on left flank and left wrist HEAD: Normal in size and contour. No evidence of trauma EYES: Lids open and close normally. No blepharitis, entropion or ectropion. PERRL. Conjunctivae are clear and sclerae are white. Lenses are without opacity EARS:  Pinnae are normal. Patient hears normal voice tunes of the examiner MOUTH and THROAT: Lips are without lesions. Oral mucosa is moist and without lesions. Tongue is normal in shape, size, and color and without lesions NECK: supple, trachea midline, no neck masses, no thyroid tenderness, no thyromegaly LYMPHATICS: no LAN in the neck, no supraclavicular LAN RESPIRATORY: breathing is even & unlabored, BS CTAB CARDIAC: RRR, no murmur,no extra heart sounds, no edema GI: abdomen soft, normal BS, no masses, no tenderness, no hepatomegaly, no splenomegaly EXTREMITIES:  Able to move 4 extremities PSYCHIATRIC: Confused and disoriented. Affect and behavior are appropriate  LABS/RADIOLOGY: Labs reviewed: Basic Metabolic Panel:  Recent Labs  12/05/14 1120 03/19/15 1133 11/03/15 1115 11/19/15 1810 11/20/15 0437  NA 140 141 143 139 139  K 4.1 4.3 4.2 3.8 4.0  CL 103 103 107 99* 104  CO2 29 26 27   --  29  GLUCOSE 85 68 107* 107* 100*  BUN 18 21 28* 24* 20  CREATININE 0.95* 0.93 1.21* 1.10* 0.83  CALCIUM 9.1 9.0 8.8  --  8.7*  MG 2.1 2.0 2.0  --   --    Liver Function Tests:  Recent Labs  12/05/14 1120 03/19/15 1133 11/03/15 1115  AST 18 20 17   ALT 11 12 11   ALKPHOS 63 71 68  BILITOT 0.5 0.6 0.5  PROT 6.3 6.6 6.3  ALBUMIN 4.0 3.8 3.8   CBC:  Recent Labs  03/19/15 1133 11/03/15 1115 11/19/15 1810 11/19/15 1945 11/20/15 0437  WBC 5.3 6.2  --  7.5 5.7  NEUTROABS 3.6 4,526  --  5.5  --   HGB 13.6 12.6 12.2 13.2 13.1  HCT 37.6 37.3 36.0 38.6 38.5  MCV 96.2 98.7  --  95.5 95.8  PLT 239 249  --  259 265   Lipid Panel:  Recent Labs  12/05/14 1120 03/19/15 1133 11/03/15 1115  HDL 56 64 60     Dg Ribs Unilateral W/chest Left  Result Date: 11/19/2015 CLINICAL DATA:  Fall 3 days ago.  Left upper rib pain. EXAM: LEFT RIBS AND CHEST - 3+ VIEW COMPARISON:  Two-view chest x-ray 08/28/2013. FINDINGS: The heart is mildly enlarged. Left ninth rib fracture is displaced  approximately half the shaft width. More minimally displaced left tenth and eleventh rib fractures are noted. There is no pneumothorax. A left pleural effusion is present. Left basilar airspace disease is noted. Atherosclerotic calcifications are present at the aorta. A remote granuloma is present in the right upper lobe. IMPRESSION: 1. Left ninth, tenth, and eleventh rib fractures. The ninth rib fracture is the most significantly displaced. 2. No pneumothorax. 3. Left pleural effusion and airspace disease  worrisome for hemorrhage and contusion. 4. Atherosclerosis. Electronically Signed   By: San Morelle M.D.   On: 11/19/2015 18:29   Dg Pelvis 1-2 Views  Result Date: 11/19/2015 CLINICAL DATA:  81 year old female fell 3 days ago, grimaces with movement. Initial encounter. EXAM: PELVIS - 1-2 VIEW COMPARISON:  None. FINDINGS: Bone mineralization is within normal limits for age. Femoral heads normally located. Hip joint spaces preserved. Pelvis intact. Grossly intact proximal femurs. Abundant bowel gas in the lower abdomen and pelvis but no abnormally dilated loops. IMPRESSION: No acute osseous abnormality identified about the pelvis. Electronically Signed   By: Genevie Ann M.D.   On: 11/19/2015 18:30   Ct Chest W Contrast  Result Date: 11/19/2015 CLINICAL DATA:  Status post fall. EXAM: CT CHEST, ABDOMEN, AND PELVIS WITH CONTRAST TECHNIQUE: Multidetector CT imaging of the chest, abdomen and pelvis was performed following the standard protocol during bolus administration of intravenous contrast. CONTRAST:  150mL ISOVUE-300 IOPAMIDOL (ISOVUE-300) INJECTION 61% COMPARISON:  CT scan of Aug 25, 2013. FINDINGS: CT CHEST No pneumothorax is noted. Calcified granuloma is noted in right upper lobe. Stable biapical scarring is noted. Minimal left pleural effusion is noted with adjacent subsegmental atelectasis. Mildly displaced fractures are seen involving the posterior portions of the left eleventh and twelfth ribs. No  mediastinal mass or adenopathy is noted. Mild pericardial effusion is noted. There is no evidence of thoracic aortic dissection or aneurysm. CT ABDOMEN AND PELVIS Status post cholecystectomy. Intrahepatic and extrahepatic biliary dilatation is noted most consistent with post cholecystectomy status. The spleen and pancreas are unremarkable. Adrenal glands and kidneys appear normal. No hydronephrosis or renal obstruction is noted. There is no evidence of abdominal aortic aneurysm or dissection. There is no evidence of bowel obstruction. Urinary bladder is unremarkable. Uterus and ovaries are unremarkable. No abnormal fluid collection is noted. Minimally displaced fracture is seen involving the left transverse process of L3. IMPRESSION: Mildly displaced left eleventh and twelfth rib fractures. Minimal left pleural effusion is noted with adjacent subsegmental atelectasis. Intrahepatic and extrahepatic biliary dilatation is noted most consistent with post cholecystectomy status. Minimally displaced fracture involving left transverse process of L3. No other abnormality seen in the abdomen or pelvis. Electronically Signed   By: Marijo Conception, M.D.   On: 11/19/2015 20:46   Ct Abdomen Pelvis W Contrast  Result Date: 11/19/2015 CLINICAL DATA:  Status post fall. EXAM: CT CHEST, ABDOMEN, AND PELVIS WITH CONTRAST TECHNIQUE: Multidetector CT imaging of the chest, abdomen and pelvis was performed following the standard protocol during bolus administration of intravenous contrast. CONTRAST:  169mL ISOVUE-300 IOPAMIDOL (ISOVUE-300) INJECTION 61% COMPARISON:  CT scan of Aug 25, 2013. FINDINGS: CT CHEST No pneumothorax is noted. Calcified granuloma is noted in right upper lobe. Stable biapical scarring is noted. Minimal left pleural effusion is noted with adjacent subsegmental atelectasis. Mildly displaced fractures are seen involving the posterior portions of the left eleventh and twelfth ribs. No mediastinal mass or adenopathy  is noted. Mild pericardial effusion is noted. There is no evidence of thoracic aortic dissection or aneurysm. CT ABDOMEN AND PELVIS Status post cholecystectomy. Intrahepatic and extrahepatic biliary dilatation is noted most consistent with post cholecystectomy status. The spleen and pancreas are unremarkable. Adrenal glands and kidneys appear normal. No hydronephrosis or renal obstruction is noted. There is no evidence of abdominal aortic aneurysm or dissection. There is no evidence of bowel obstruction. Urinary bladder is unremarkable. Uterus and ovaries are unremarkable. No abnormal fluid collection is noted. Minimally displaced fracture is seen  involving the left transverse process of L3. IMPRESSION: Mildly displaced left eleventh and twelfth rib fractures. Minimal left pleural effusion is noted with adjacent subsegmental atelectasis. Intrahepatic and extrahepatic biliary dilatation is noted most consistent with post cholecystectomy status. Minimally displaced fracture involving left transverse process of L3. No other abnormality seen in the abdomen or pelvis. Electronically Signed   By: Marijo Conception, M.D.   On: 11/19/2015 20:46    ASSESSMENT/PLAN:  Physical deconditioning - for rehabilitation, PT and OT for strengthening exercises; fall precaution  L3 and multiple left ribs (9-12) fractures - sustained from fall @ home; for rehabilitation, PT and OT strengthening exercises; continue Norco 5/325 mg 1 tab by mouth every 6 hours when necessary for pain; fall precaution  Hypothyroidism - continue Synthroid 50 g 1 tab by mouth daily Lab Results  Component Value Date   TSH 2.27 11/03/2015   Depression - mood is stable; continue Celexa 10 mg 1 tab by mouth daily at bedtime  Vitamin D deficiency - continue vitamin D 2000 units by mouth twice a day  Hypertension - OFF all medications; well controlled; will monitor  Dementia - advanced; continue supportive care and fall precaution     Goals of  care:  Short-term rehabilitation    Durenda Age, NP [place] [phone]

## 2015-11-25 ENCOUNTER — Encounter: Payer: Self-pay | Admitting: Internal Medicine

## 2015-11-25 ENCOUNTER — Non-Acute Institutional Stay (SKILLED_NURSING_FACILITY): Payer: Medicare Other | Admitting: Internal Medicine

## 2015-11-25 DIAGNOSIS — R5381 Other malaise: Secondary | ICD-10-CM | POA: Diagnosis not present

## 2015-11-25 DIAGNOSIS — F329 Major depressive disorder, single episode, unspecified: Secondary | ICD-10-CM

## 2015-11-25 DIAGNOSIS — F0390 Unspecified dementia without behavioral disturbance: Secondary | ICD-10-CM

## 2015-11-25 DIAGNOSIS — E46 Unspecified protein-calorie malnutrition: Secondary | ICD-10-CM

## 2015-11-25 DIAGNOSIS — R278 Other lack of coordination: Secondary | ICD-10-CM | POA: Diagnosis not present

## 2015-11-25 DIAGNOSIS — F03C0 Unspecified dementia, severe, without behavioral disturbance, psychotic disturbance, mood disturbance, and anxiety: Secondary | ICD-10-CM

## 2015-11-25 DIAGNOSIS — S2232XS Fracture of one rib, left side, sequela: Secondary | ICD-10-CM

## 2015-11-25 DIAGNOSIS — E039 Hypothyroidism, unspecified: Secondary | ICD-10-CM | POA: Diagnosis not present

## 2015-11-25 DIAGNOSIS — M6281 Muscle weakness (generalized): Secondary | ICD-10-CM | POA: Diagnosis not present

## 2015-11-25 NOTE — Progress Notes (Signed)
LOCATION: [place]  PCP: Alesia Richards, MD   Code Status: DNR  Goals of care: Advanced Directive information Advanced Directives 11/25/2015  Does patient have an advance directive? Yes  Type of Advance Directive Out of facility DNR (pink MOST or yellow form)  Does patient want to make changes to advanced directive? No - Patient declined  Copy of advanced directive(s) in chart? Yes       Extended Emergency Contact Information Primary Emergency Contact: Chubbuck,Stanley Address: [address]          [place], [place] [address] [place] Phone: [phone] Relation: Spouse Secondary Emergency Contact: Dan Humphreys Address: [address]          [place], [place] [address] [place] of [place] Phone: [phone] Mobile Phone: [phone] Relation: Nephew   No Known Allergies  Chief Complaint  Patient presents with  . New Admit To SNF    New Admission     HPI:  Patient is a 81 y.o. female seen today for short term rehabilitation post hospital admission from 11/19/15-11/21/15 with left 9th-12th rib minimally displaced fracture post fall. No surgery was recommended. She was placed on pain medications. She is here for short term rehabilitation and possible long term care. She has advanced dementia and is not able to participate in HPI and ROS.   Review of Systems: unable to obtain   Past Medical History:  Diagnosis Date  . Atrial flutter ([place])   . Breast cancer ([place]) 1970   Right  . Dementia    moderate  . Hypothyroid   . IBS (irritable bowel syndrome)   . Labile hypertension   . Vitamin D deficiency    Past Surgical History:  Procedure Laterality Date  . ABLATION  08-28-2013   RFCA ablation of atrial flutter by Dr Lovena Le  . ATRIAL FLUTTER ABLATION N/A 08/28/2013   Procedure: ATRIAL FLUTTER ABLATION;  Surgeon: Evans Lance, MD;  Location: [facility] CATH LAB;  Service: Cardiovascular;  Laterality: N/A;  . BIOPSY THYROID     1999  . CHOLECYSTECTOMY     1988  . MASTECTOMY     right 1978 left 1980   Social History:   reports that she has never smoked. She has never used smokeless tobacco. She reports that she does not drink alcohol or use drugs.  Family History  Problem Relation Age of Onset  . Hypertension Mother   . Cancer Mother     colon  . Stroke Father   . Heart disease Father   . Diabetes Brother     Medications:   Medication List       Accurate as of 11/25/15 12:04 PM. Always use your most recent med list.          aspirin EC 81 MG tablet Take 81 mg by mouth every evening.   cholecalciferol 1000 units tablet Commonly known as:  VITAMIN D Take 2,000 Units by mouth 2 (two) times daily.   citalopram 10 MG tablet Commonly known as:  CELEXA Take 10 mg by mouth every evening.   HYDROcodone-acetaminophen 5-325 MG tablet Commonly known as:  NORCO/VICODIN Take 1 tablet by mouth every 6 (six) hours as needed for moderate pain.   levothyroxine 50 MCG tablet Commonly known as:  SYNTHROID, LEVOTHROID Take 50 mcg by mouth daily before breakfast.   oyster calcium 500 MG Tabs tablet Take 500 mg of elemental calcium by mouth every evening.       Immunizations:  Immunization History  Administered Date(s) Administered  . Influenza Split 02/13/2013  . Influenza, High Dose Seasonal PF 02/26/2014, 03/19/2015  . Influenza-Unspecified 01/17/2013  . Pneumococcal Conjugate-13 02/18/2014  . Pneumococcal Polysaccharide-23 09/02/2008  . Td 04/20/2006     Physical Exam:  Vitals:   11/25/15 1200  BP: 128/72  Pulse: 72  Resp: 19  Temp: 99 F (37.2 C)  TempSrc: Oral  SpO2: 94%  Weight: 117 lb (53.1 kg)  Height: 5\' 9"  (1.753 m)   Body mass index is 17.28 kg/m.  General- elderly female, frail and thin built, in no acute distress Head- normocephalic, atraumatic Nose- no nasal discharge Throat- moist mucus membrane Eyes- PERRLA, EOMI, no pallor, no icterus Neck- no cervical  lymphadenopathy Cardiovascular- normal s1,s2, no murmur Respiratory- bilateral clear to auscultation, no wheeze, no rhonchi, no crackles, no use of accessory muscles Abdomen- bowel sounds present, soft, non tender Musculoskeletal- able to move all 4 extremities, generalized weakness, on wheelchair Neurological- alert and oriented to self only Skin- warm and dry, bruises present   Labs reviewed: Basic Metabolic Panel:  Recent Labs  12/05/14 1120 03/19/15 1133 11/03/15 1115 11/19/15 1810 11/20/15 0437  NA 140 141 143 139 139  K 4.1 4.3 4.2 3.8 4.0  CL 103 103 107 99* 104  CO2 29 26 27   --  29  GLUCOSE 85 68 107* 107* 100*  BUN 18 21 28* 24* 20  CREATININE 0.95* 0.93 1.21* 1.10* 0.83  CALCIUM 9.1 9.0 8.8  --  8.7*  MG 2.1 2.0 2.0  --   --    Liver Function Tests:  Recent Labs  12/05/14 1120 03/19/15 1133 11/03/15 1115  AST 18 20 17   ALT 11 12 11   ALKPHOS 63 71 68  BILITOT 0.5 0.6 0.5  PROT 6.3 6.6 6.3  ALBUMIN 4.0 3.8 3.8   No results for input(s): LIPASE, AMYLASE in the last 8760 hours. No results for input(s): AMMONIA in the last 8760 hours. CBC:  Recent Labs  03/19/15 1133 11/03/15 1115 11/19/15 1810 11/19/15 1945 11/20/15 0437  WBC 5.3 6.2  --  7.5 5.7  NEUTROABS 3.6 4,526  --  5.5  --   HGB 13.6 12.6 12.2 13.2 13.1  HCT 37.6 37.3 36.0 38.6 38.5  MCV 96.2 98.7  --  95.5 95.8  PLT 239 249  --  259 265    Radiological Exams: Dg Ribs Unilateral W/chest Left  Result Date: 11/19/2015 CLINICAL DATA:  Fall 3 days ago.  Left upper rib pain. EXAM: LEFT RIBS AND CHEST - 3+ VIEW COMPARISON:  Two-view chest x-ray 08/28/2013. FINDINGS: The heart is mildly enlarged. Left ninth rib fracture is displaced approximately half the shaft width. More minimally displaced left tenth and eleventh rib fractures are noted. There is no pneumothorax. A left pleural effusion is present. Left basilar airspace disease is noted. Atherosclerotic calcifications are present at the aorta.  A remote granuloma is present in the right upper lobe. IMPRESSION: 1. Left ninth, tenth, and eleventh rib fractures. The ninth rib fracture is the most significantly displaced. 2. No pneumothorax. 3. Left pleural effusion and airspace disease worrisome for hemorrhage and contusion. 4. Atherosclerosis. Electronically Signed   By: San Morelle M.D.   On: 11/19/2015 18:29   Dg Pelvis 1-2 Views  Result Date: 11/19/2015 CLINICAL DATA:  80 year old female fell 3 days ago, grimaces with movement. Initial encounter. EXAM: PELVIS - 1-2 VIEW COMPARISON:  None. FINDINGS: Bone mineralization is within normal limits for age. Femoral heads normally  located. Hip joint spaces preserved. Pelvis intact. Grossly intact proximal femurs. Abundant bowel gas in the lower abdomen and pelvis but no abnormally dilated loops. IMPRESSION: No acute osseous abnormality identified about the pelvis. Electronically Signed   By: Genevie Ann M.D.   On: 11/19/2015 18:30   Ct Chest W Contrast  Result Date: 11/19/2015 CLINICAL DATA:  Status post fall. EXAM: CT CHEST, ABDOMEN, AND PELVIS WITH CONTRAST TECHNIQUE: Multidetector CT imaging of the chest, abdomen and pelvis was performed following the standard protocol during bolus administration of intravenous contrast. CONTRAST:  151mL ISOVUE-300 IOPAMIDOL (ISOVUE-300) INJECTION 61% COMPARISON:  CT scan of Aug 25, 2013. FINDINGS: CT CHEST No pneumothorax is noted. Calcified granuloma is noted in right upper lobe. Stable biapical scarring is noted. Minimal left pleural effusion is noted with adjacent subsegmental atelectasis. Mildly displaced fractures are seen involving the posterior portions of the left eleventh and twelfth ribs. No mediastinal mass or adenopathy is noted. Mild pericardial effusion is noted. There is no evidence of thoracic aortic dissection or aneurysm. CT ABDOMEN AND PELVIS Status post cholecystectomy. Intrahepatic and extrahepatic biliary dilatation is noted most consistent  with post cholecystectomy status. The spleen and pancreas are unremarkable. Adrenal glands and kidneys appear normal. No hydronephrosis or renal obstruction is noted. There is no evidence of abdominal aortic aneurysm or dissection. There is no evidence of bowel obstruction. Urinary bladder is unremarkable. Uterus and ovaries are unremarkable. No abnormal fluid collection is noted. Minimally displaced fracture is seen involving the left transverse process of L3. IMPRESSION: Mildly displaced left eleventh and twelfth rib fractures. Minimal left pleural effusion is noted with adjacent subsegmental atelectasis. Intrahepatic and extrahepatic biliary dilatation is noted most consistent with post cholecystectomy status. Minimally displaced fracture involving left transverse process of L3. No other abnormality seen in the abdomen or pelvis. Electronically Signed   By: Marijo Conception, M.D.   On: 11/19/2015 20:46   Ct Abdomen Pelvis W Contrast  Result Date: 11/19/2015 CLINICAL DATA:  Status post fall. EXAM: CT CHEST, ABDOMEN, AND PELVIS WITH CONTRAST TECHNIQUE: Multidetector CT imaging of the chest, abdomen and pelvis was performed following the standard protocol during bolus administration of intravenous contrast. CONTRAST:  145mL ISOVUE-300 IOPAMIDOL (ISOVUE-300) INJECTION 61% COMPARISON:  CT scan of Aug 25, 2013. FINDINGS: CT CHEST No pneumothorax is noted. Calcified granuloma is noted in right upper lobe. Stable biapical scarring is noted. Minimal left pleural effusion is noted with adjacent subsegmental atelectasis. Mildly displaced fractures are seen involving the posterior portions of the left eleventh and twelfth ribs. No mediastinal mass or adenopathy is noted. Mild pericardial effusion is noted. There is no evidence of thoracic aortic dissection or aneurysm. CT ABDOMEN AND PELVIS Status post cholecystectomy. Intrahepatic and extrahepatic biliary dilatation is noted most consistent with post cholecystectomy  status. The spleen and pancreas are unremarkable. Adrenal glands and kidneys appear normal. No hydronephrosis or renal obstruction is noted. There is no evidence of abdominal aortic aneurysm or dissection. There is no evidence of bowel obstruction. Urinary bladder is unremarkable. Uterus and ovaries are unremarkable. No abnormal fluid collection is noted. Minimally displaced fracture is seen involving the left transverse process of L3. IMPRESSION: Mildly displaced left eleventh and twelfth rib fractures. Minimal left pleural effusion is noted with adjacent subsegmental atelectasis. Intrahepatic and extrahepatic biliary dilatation is noted most consistent with post cholecystectomy status. Minimally displaced fracture involving left transverse process of L3. No other abnormality seen in the abdomen or pelvis. Electronically Signed   By: Sabino Dick  Brooke Bonito, M.D.   On: 11/19/2015 20:46    Assessment/Plan  Physical deconditioning Will have patient work with PT/OT as tolerated to regain strength and restore function.  Fall precautions are in place.  Left rib fracture Continue norco 5-325 mg q6h prn for pain. Encourage to use incentive spirometer  Advanced dementia Will need total care. To provide supportive care. Fall precautions and pressure ulcer prophylaxis. SLP to evaluate for dysphagia  Protein calorie malnutrition Monitor po intake and weight. Add procel 1 scoop bid with meals. Get RD consult.   Hypothyroidism  Continue levothyroxine 50 mcg daily  Chronic depression Continue celexa 10 mg daily    Goals of care: short term rehabilitation   Labs/tests ordered: cbc, cmp, tsh    Family/ staff Communication: reviewed care plan with patient and nursing supervisor    Blanchie Serve, MD Internal Medicine [place], [place] [address] Cell Phone (Monday-Friday 8 am - 5 pm): [phone] On Call: [phone] and follow prompts after  5 pm and on weekends Office Phone: [phone] Office Fax: [phone]

## 2015-11-26 DIAGNOSIS — R278 Other lack of coordination: Secondary | ICD-10-CM | POA: Diagnosis not present

## 2015-11-26 DIAGNOSIS — M6281 Muscle weakness (generalized): Secondary | ICD-10-CM | POA: Diagnosis not present

## 2015-11-27 DIAGNOSIS — M6281 Muscle weakness (generalized): Secondary | ICD-10-CM | POA: Diagnosis not present

## 2015-11-27 DIAGNOSIS — R278 Other lack of coordination: Secondary | ICD-10-CM | POA: Diagnosis not present

## 2015-11-28 DIAGNOSIS — R278 Other lack of coordination: Secondary | ICD-10-CM | POA: Diagnosis not present

## 2015-11-28 DIAGNOSIS — M6281 Muscle weakness (generalized): Secondary | ICD-10-CM | POA: Diagnosis not present

## 2015-12-01 DIAGNOSIS — R278 Other lack of coordination: Secondary | ICD-10-CM | POA: Diagnosis not present

## 2015-12-01 DIAGNOSIS — M6281 Muscle weakness (generalized): Secondary | ICD-10-CM | POA: Diagnosis not present

## 2015-12-02 DIAGNOSIS — R278 Other lack of coordination: Secondary | ICD-10-CM | POA: Diagnosis not present

## 2015-12-02 DIAGNOSIS — M6281 Muscle weakness (generalized): Secondary | ICD-10-CM | POA: Diagnosis not present

## 2015-12-03 ENCOUNTER — Other Ambulatory Visit: Payer: Self-pay | Admitting: *Deleted

## 2015-12-03 DIAGNOSIS — Z1212 Encounter for screening for malignant neoplasm of rectum: Secondary | ICD-10-CM

## 2015-12-03 DIAGNOSIS — M6281 Muscle weakness (generalized): Secondary | ICD-10-CM | POA: Diagnosis not present

## 2015-12-03 DIAGNOSIS — R278 Other lack of coordination: Secondary | ICD-10-CM | POA: Diagnosis not present

## 2015-12-03 LAB — POC HEMOCCULT BLD/STL (HOME/3-CARD/SCREEN)
Card #2 Fecal Occult Blod, POC: NEGATIVE
FECAL OCCULT BLD: NEGATIVE
FECAL OCCULT BLD: NEGATIVE

## 2015-12-04 DIAGNOSIS — M6281 Muscle weakness (generalized): Secondary | ICD-10-CM | POA: Diagnosis not present

## 2015-12-04 DIAGNOSIS — R278 Other lack of coordination: Secondary | ICD-10-CM | POA: Diagnosis not present

## 2015-12-05 DIAGNOSIS — R278 Other lack of coordination: Secondary | ICD-10-CM | POA: Diagnosis not present

## 2015-12-05 DIAGNOSIS — M6281 Muscle weakness (generalized): Secondary | ICD-10-CM | POA: Diagnosis not present

## 2015-12-08 DIAGNOSIS — M6281 Muscle weakness (generalized): Secondary | ICD-10-CM | POA: Diagnosis not present

## 2015-12-08 DIAGNOSIS — R278 Other lack of coordination: Secondary | ICD-10-CM | POA: Diagnosis not present

## 2015-12-09 DIAGNOSIS — M6281 Muscle weakness (generalized): Secondary | ICD-10-CM | POA: Diagnosis not present

## 2015-12-09 DIAGNOSIS — R278 Other lack of coordination: Secondary | ICD-10-CM | POA: Diagnosis not present

## 2015-12-10 DIAGNOSIS — M6281 Muscle weakness (generalized): Secondary | ICD-10-CM | POA: Diagnosis not present

## 2015-12-10 DIAGNOSIS — R278 Other lack of coordination: Secondary | ICD-10-CM | POA: Diagnosis not present

## 2015-12-11 DIAGNOSIS — R278 Other lack of coordination: Secondary | ICD-10-CM | POA: Diagnosis not present

## 2015-12-11 DIAGNOSIS — M6281 Muscle weakness (generalized): Secondary | ICD-10-CM | POA: Diagnosis not present

## 2015-12-12 DIAGNOSIS — R278 Other lack of coordination: Secondary | ICD-10-CM | POA: Diagnosis not present

## 2015-12-12 DIAGNOSIS — M6281 Muscle weakness (generalized): Secondary | ICD-10-CM | POA: Diagnosis not present

## 2015-12-18 ENCOUNTER — Other Ambulatory Visit: Payer: Self-pay

## 2015-12-18 MED ORDER — HYDROCODONE-ACETAMINOPHEN 5-325 MG PO TABS: 1.0000 | ORAL_TABLET | Freq: Four times a day (QID) | ORAL | 0 refills | Status: DC | PRN

## 2015-12-18 NOTE — Telephone Encounter (Signed)
Rx faxed to [facility] @ [phone], phone number [phone]  

## 2015-12-25 ENCOUNTER — Non-Acute Institutional Stay (SKILLED_NURSING_FACILITY): Payer: Medicare Other | Admitting: Adult Health

## 2015-12-25 ENCOUNTER — Encounter: Payer: Self-pay | Admitting: Adult Health

## 2015-12-25 DIAGNOSIS — E039 Hypothyroidism, unspecified: Secondary | ICD-10-CM | POA: Diagnosis not present

## 2015-12-25 DIAGNOSIS — E559 Vitamin D deficiency, unspecified: Secondary | ICD-10-CM

## 2015-12-25 DIAGNOSIS — S32038S Other fracture of third lumbar vertebra, sequela: Secondary | ICD-10-CM | POA: Diagnosis not present

## 2015-12-25 DIAGNOSIS — S2242XS Multiple fractures of ribs, left side, sequela: Secondary | ICD-10-CM

## 2015-12-25 DIAGNOSIS — F329 Major depressive disorder, single episode, unspecified: Secondary | ICD-10-CM | POA: Diagnosis not present

## 2015-12-25 DIAGNOSIS — F0390 Unspecified dementia without behavioral disturbance: Secondary | ICD-10-CM

## 2015-12-25 DIAGNOSIS — I10 Essential (primary) hypertension: Secondary | ICD-10-CM | POA: Diagnosis not present

## 2015-12-25 DIAGNOSIS — R0989 Other specified symptoms and signs involving the circulatory and respiratory systems: Secondary | ICD-10-CM

## 2015-12-25 DIAGNOSIS — F03C0 Unspecified dementia, severe, without behavioral disturbance, psychotic disturbance, mood disturbance, and anxiety: Secondary | ICD-10-CM

## 2015-12-25 NOTE — Progress Notes (Signed)
Patient ID: Amber Chan, female   DOB: 10/21/1934, 81 y.o.   MRN: [id number]    DATE:    12/25/15   MRN:  [id number]  BIRTHDAY: [birth year]  Facility:  Nursing Home Location:  [place] Room Number: [id number]  LEVEL OF CARE:  SNF [phone])  Contact Information    Name Relation Home Work Mobile   Ore,Stanley Spouse [place] [phone]  [phone]   [place] [id number]  [phone]       Code Status History    Date Active Date Inactive Code Status Order ID Comments User Context   11/21/2015 12:10 PM 11/21/2015  4:49 PM DNR [id number]  Verlee Monte, MD Inpatient   11/20/2015 12:05 AM 11/21/2015 12:10 PM Full Code [id number]  Karmen Bongo, MD Inpatient   08/28/2013  2:46 PM 08/29/2013  2:19 PM Full Code [id number]  Evans Lance, MD Inpatient   08/26/2013  1:55 AM 08/28/2013  2:46 PM Full Code [id number]  Phillips Grout, MD Inpatient    Questions for Most Recent Historical Code Status (Order [id number])    Question Answer Comment   In the event of cardiac or respiratory ARREST Do not call a "code blue"    In the event of cardiac or respiratory ARREST Do not perform Intubation, CPR, defibrillation or ACLS    In the event of cardiac or respiratory ARREST Use medication by any route, position, wound care, and other measures to relive pain and suffering. May use oxygen, suction and manual treatment of airway obstruction as needed for comfort.         Advance Directive Documentation   Flowsheet Row Most Recent Value  Type of Advance Directive  Out of facility DNR (pink MOST or yellow form)  Pre-existing out of facility DNR order (yellow form or pink MOST form)  No data  "MOST" Form in Place?  No data       Chief Complaint  Patient presents with  . Medical Management of Chronic Issues    HISTORY OF PRESENT ILLNESS:  This is a 81 year old female is being seen for a routine visit. She is a  long-term resident of [facility].  She is seen today in her room with husband at bedside. She did not complain of any pain. Her mood is stable and continues to take Celexa 10 mg daily. BPs 106/64, 141/73, 107/77, 132/68 - stable.   PAST MEDICAL HISTORY:  Past Medical History:  Diagnosis Date  . Atrial flutter ([place])   . Breast cancer ([place]) 1970   Right  . Dementia    moderate  . Hypothyroid   . IBS (irritable bowel syndrome)   . Labile hypertension   . Vitamin D deficiency      CURRENT MEDICATIONS: Reviewed  Patient's Medications  New Prescriptions   No medications on file  Previous Medications   ASPIRIN EC 81 MG TABLET    Take 81 mg by mouth every evening.   CHOLECALCIFEROL (VITAMIN D) 1000 UNITS TABLET    Take 2,000 Units by mouth 2 (two) times daily.   CITALOPRAM (CELEXA) 10 MG TABLET    Take 10 mg by mouth every evening.    HYDROCODONE-ACETAMINOPHEN (NORCO/VICODIN) 5-325 MG TABLET    Take 1 tablet by mouth every 6 (six) hours as needed for moderate pain. DO NOT EXCEED 4GM OF APAP IN 24 HOURS FROM ALL SOURCES   LEVOTHYROXINE (SYNTHROID, LEVOTHROID) 50 MCG TABLET  Take 50 mcg by mouth daily before breakfast.    OYSTER SHELL (OYSTER CALCIUM) 500 MG TABS TABLET    Take 500 mg of elemental calcium by mouth every evening.  Modified Medications   No medications on file  Discontinued Medications   No medications on file     No Known Allergies   REVIEW OF SYSTEMS:  Unable to obtain due to advanced dementia    PHYSICAL EXAMINATION  GENERAL APPEARANCE:  In no acute distress.  SKIN:  Skin is warm and dry. HEAD: Normal in size and contour. No evidence of trauma EYES: Lids open and close normally. No blepharitis, entropion or ectropion. PERRL. Conjunctivae are clear and sclerae are white. Lenses are without opacity EARS: Pinnae are normal. Patient hears normal voice tunes of the examiner MOUTH and THROAT: Lips are without lesions. Oral mucosa is moist and without lesions.  Tongue is normal in shape, size, and color and without lesions NECK: supple, trachea midline, no neck masses, no thyroid tenderness, no thyromegaly LYMPHATICS: no LAN in the neck, no supraclavicular LAN RESPIRATORY: breathing is even & unlabored, BS CTAB CARDIAC: RRR, no murmur,no extra heart sounds, no edema GI: abdomen soft, normal BS, no masses, no tenderness, no hepatomegaly, no splenomegaly EXTREMITIES:  Able to move 4 extremities PSYCHIATRIC: Confused and disoriented. Affect and behavior are appropriate  LABS/RADIOLOGY: Labs reviewed: Basic Metabolic Panel:  Recent Labs  03/19/15 1133 11/03/15 1115 11/19/15 1810 11/20/15 0437  NA 141 143 139 139  K 4.3 4.2 3.8 4.0  CL 103 107 99* 104  CO2 26 27  --  29  GLUCOSE 68 107* 107* 100*  BUN 21 28* 24* 20  CREATININE 0.93 1.21* 1.10* 0.83  CALCIUM 9.0 8.8  --  8.7*  MG 2.0 2.0  --   --    Liver Function Tests:  Recent Labs  03/19/15 1133 11/03/15 1115  AST 20 17  ALT 12 11  ALKPHOS 71 68  BILITOT 0.6 0.5  PROT 6.6 6.3  ALBUMIN 3.8 3.8   CBC:  Recent Labs  03/19/15 1133 11/03/15 1115 11/19/15 1810 11/19/15 1945 11/20/15 0437  WBC 5.3 6.2  --  7.5 5.7  NEUTROABS 3.6 4,526  --  5.5  --   HGB 13.6 12.6 12.2 13.2 13.1  HCT 37.6 37.3 36.0 38.6 38.5  MCV 96.2 98.7  --  95.5 95.8  PLT 239 249  --  259 265   Lipid Panel:  Recent Labs  03/19/15 1133 11/03/15 1115  HDL 64 60      ASSESSMENT/PLAN:  L3 and multiple left ribs (9-12) fractures -  continue Norco 5/325 mg 1 tab by mouth every 6 hours when necessary for pain; fall precaution  Hypothyroidism - continue Synthroid 50 g 1 tab by mouth daily Lab Results  Component Value Date   TSH 2.27 11/03/2015   Depression - mood is stable; continue Celexa 10 mg 1 tab by mouth daily at bedtime  Vitamin D deficiency - continue vitamin D 2000 units by mouth twice a day  Hypertension - OFF all medications; well controlled  Dementia - advanced; continue  supportive care and fall precaution     Goals of care:  Long-term care    Durenda Age, NP [place] [phone]

## 2016-01-19 DIAGNOSIS — E46 Unspecified protein-calorie malnutrition: Secondary | ICD-10-CM | POA: Diagnosis not present

## 2016-01-19 DIAGNOSIS — E039 Hypothyroidism, unspecified: Secondary | ICD-10-CM | POA: Diagnosis not present

## 2016-01-19 DIAGNOSIS — F339 Major depressive disorder, recurrent, unspecified: Secondary | ICD-10-CM | POA: Diagnosis not present

## 2016-01-19 DIAGNOSIS — G309 Alzheimer's disease, unspecified: Secondary | ICD-10-CM | POA: Diagnosis not present

## 2016-01-19 DIAGNOSIS — I10 Essential (primary) hypertension: Secondary | ICD-10-CM | POA: Diagnosis not present

## 2016-01-19 DIAGNOSIS — I4891 Unspecified atrial fibrillation: Secondary | ICD-10-CM | POA: Diagnosis not present

## 2016-01-20 DIAGNOSIS — E46 Unspecified protein-calorie malnutrition: Secondary | ICD-10-CM | POA: Diagnosis not present

## 2016-01-20 DIAGNOSIS — I4891 Unspecified atrial fibrillation: Secondary | ICD-10-CM | POA: Diagnosis not present

## 2016-01-20 DIAGNOSIS — F339 Major depressive disorder, recurrent, unspecified: Secondary | ICD-10-CM | POA: Diagnosis not present

## 2016-01-20 DIAGNOSIS — G309 Alzheimer's disease, unspecified: Secondary | ICD-10-CM | POA: Diagnosis not present

## 2016-01-20 DIAGNOSIS — E039 Hypothyroidism, unspecified: Secondary | ICD-10-CM | POA: Diagnosis not present

## 2016-01-20 DIAGNOSIS — I10 Essential (primary) hypertension: Secondary | ICD-10-CM | POA: Diagnosis not present

## 2016-01-22 DIAGNOSIS — I10 Essential (primary) hypertension: Secondary | ICD-10-CM | POA: Diagnosis not present

## 2016-01-22 DIAGNOSIS — E039 Hypothyroidism, unspecified: Secondary | ICD-10-CM | POA: Diagnosis not present

## 2016-01-22 DIAGNOSIS — I4891 Unspecified atrial fibrillation: Secondary | ICD-10-CM | POA: Diagnosis not present

## 2016-01-22 DIAGNOSIS — G309 Alzheimer's disease, unspecified: Secondary | ICD-10-CM | POA: Diagnosis not present

## 2016-01-22 DIAGNOSIS — E46 Unspecified protein-calorie malnutrition: Secondary | ICD-10-CM | POA: Diagnosis not present

## 2016-01-22 DIAGNOSIS — F339 Major depressive disorder, recurrent, unspecified: Secondary | ICD-10-CM | POA: Diagnosis not present

## 2016-01-26 DIAGNOSIS — I4891 Unspecified atrial fibrillation: Secondary | ICD-10-CM | POA: Diagnosis not present

## 2016-01-26 DIAGNOSIS — G309 Alzheimer's disease, unspecified: Secondary | ICD-10-CM | POA: Diagnosis not present

## 2016-01-26 DIAGNOSIS — F339 Major depressive disorder, recurrent, unspecified: Secondary | ICD-10-CM | POA: Diagnosis not present

## 2016-01-26 DIAGNOSIS — E039 Hypothyroidism, unspecified: Secondary | ICD-10-CM | POA: Diagnosis not present

## 2016-01-26 DIAGNOSIS — I10 Essential (primary) hypertension: Secondary | ICD-10-CM | POA: Diagnosis not present

## 2016-01-26 DIAGNOSIS — E46 Unspecified protein-calorie malnutrition: Secondary | ICD-10-CM | POA: Diagnosis not present

## 2016-01-28 DIAGNOSIS — I10 Essential (primary) hypertension: Secondary | ICD-10-CM | POA: Diagnosis not present

## 2016-01-28 DIAGNOSIS — E46 Unspecified protein-calorie malnutrition: Secondary | ICD-10-CM | POA: Diagnosis not present

## 2016-01-28 DIAGNOSIS — G309 Alzheimer's disease, unspecified: Secondary | ICD-10-CM | POA: Diagnosis not present

## 2016-01-28 DIAGNOSIS — F339 Major depressive disorder, recurrent, unspecified: Secondary | ICD-10-CM | POA: Diagnosis not present

## 2016-01-28 DIAGNOSIS — I4891 Unspecified atrial fibrillation: Secondary | ICD-10-CM | POA: Diagnosis not present

## 2016-01-28 DIAGNOSIS — E039 Hypothyroidism, unspecified: Secondary | ICD-10-CM | POA: Diagnosis not present

## 2016-01-30 ENCOUNTER — Non-Acute Institutional Stay (SKILLED_NURSING_FACILITY): Payer: Medicare Other | Admitting: Adult Health

## 2016-01-30 ENCOUNTER — Encounter: Payer: Self-pay | Admitting: Adult Health

## 2016-01-30 DIAGNOSIS — F03C0 Unspecified dementia, severe, without behavioral disturbance, psychotic disturbance, mood disturbance, and anxiety: Secondary | ICD-10-CM

## 2016-01-30 DIAGNOSIS — F329 Major depressive disorder, single episode, unspecified: Secondary | ICD-10-CM

## 2016-01-30 DIAGNOSIS — E039 Hypothyroidism, unspecified: Secondary | ICD-10-CM

## 2016-01-30 DIAGNOSIS — F0390 Unspecified dementia without behavioral disturbance: Secondary | ICD-10-CM | POA: Diagnosis not present

## 2016-01-30 DIAGNOSIS — S32038S Other fracture of third lumbar vertebra, sequela: Secondary | ICD-10-CM | POA: Diagnosis not present

## 2016-01-30 DIAGNOSIS — E559 Vitamin D deficiency, unspecified: Secondary | ICD-10-CM

## 2016-01-30 DIAGNOSIS — E440 Moderate protein-calorie malnutrition: Secondary | ICD-10-CM

## 2016-01-30 NOTE — Progress Notes (Signed)
Patient ID: Amber Chan, female   DOB: 1934-08-20, 81 y.o.   MRN: [id number]    DATE:    01/30/16  MRN:  [id number]  BIRTHDAY: 05-10-1934  Facility:  Nursing Home Location:  [place] Room Number: [id number]  LEVEL OF CARE:  SNF [phone])  Contact Information    Name Relation Home Work Mobile   Lingo,Stanley Spouse [place] [phone]  [phone]   [place] [id number]  [phone]       Code Status History    Date Active Date Inactive Code Status Order ID Comments User Context   11/21/2015 12:10 PM 11/21/2015  4:49 PM DNR [id number]  Verlee Monte, MD Inpatient   11/20/2015 12:05 AM 11/21/2015 12:10 PM Full Code [id number]  Karmen Bongo, MD Inpatient   08/28/2013  2:46 PM 08/29/2013  2:19 PM Full Code [id number]  Evans Lance, MD Inpatient   08/26/2013  1:55 AM 08/28/2013  2:46 PM Full Code [id number]  Phillips Grout, MD Inpatient    Questions for Most Recent Historical Code Status (Order [id number])    Question Answer Comment   In the event of cardiac or respiratory ARREST Do not call a "code blue"    In the event of cardiac or respiratory ARREST Do not perform Intubation, CPR, defibrillation or ACLS    In the event of cardiac or respiratory ARREST Use medication by any route, position, wound care, and other measures to relive pain and suffering. May use oxygen, suction and manual treatment of airway obstruction as needed for comfort.         Advance Directive Documentation   Flowsheet Row Most Recent Value  Type of Advance Directive  Out of facility DNR (pink MOST or yellow form)  Pre-existing out of facility DNR order (yellow form or pink MOST form)  No data  "MOST" Form in Place?  No data       Chief Complaint  Patient presents with  . Medical Management of Chronic Issues    HISTORY OF PRESENT ILLNESS:  This is a 81 year old female is being seen for a routine visit. She is a  long-term resident of [facility].  She recently became hospice/comfort care. Med pass BID was ordered yesterday for nutritional support.   PAST MEDICAL HISTORY:  Past Medical History:  Diagnosis Date  . Atrial flutter ([place])   . Breast cancer ([place]) 1970   Right  . Dementia    moderate  . Hypothyroid   . IBS (irritable bowel syndrome)   . Labile hypertension   . Vitamin D deficiency      CURRENT MEDICATIONS: Reviewed  Patient's Medications  New Prescriptions   No medications on file  Previous Medications   ASPIRIN EC 81 MG TABLET    Take 81 mg by mouth every evening.   CHOLECALCIFEROL (VITAMIN D) 2000 UNITS CAPS    Take 1 capsule by mouth 2 (two) times daily.   CITALOPRAM (CELEXA) 10 MG TABLET    Take 10 mg by mouth at bedtime.    HYDROCODONE-ACETAMINOPHEN (NORCO/VICODIN) 5-325 MG TABLET    Take 1 tablet by mouth every 6 (six) hours as needed for moderate pain. DO NOT EXCEED 4GM OF APAP IN 24 HOURS FROM ALL SOURCES   LEVOTHYROXINE (SYNTHROID, LEVOTHROID) 50 MCG TABLET    Take 50 mcg by mouth daily before breakfast.    NUTRITIONAL SUPPLEMENTS (NUTRITIONAL SUPPLEMENT PO)    Take 90  mLs by mouth 2 (two) times daily. MedPass   OYSTER SHELL (OYSTER CALCIUM) 500 MG TABS TABLET    Take 500 mg of elemental calcium by mouth at bedtime.   Modified Medications   No medications on file  Discontinued Medications   CHOLECALCIFEROL (VITAMIN D) 1000 UNITS TABLET    Take 2,000 Units by mouth 2 (two) times daily.     No Known Allergies   REVIEW OF SYSTEMS:  Unable to obtain due to advanced dementia    PHYSICAL EXAMINATION  GENERAL APPEARANCE:  In no acute distress.  SKIN:  Skin is warm and dry. HEAD: Normal in size and contour. No evidence of trauma EYES: Lids open and close normally. No blepharitis, entropion or ectropion. PERRL. Conjunctivae are clear and sclerae are white. Lenses are without opacity EARS: Pinnae are normal. Patient hears normal voice tunes of the examiner MOUTH  and THROAT: Lips are without lesions. Oral mucosa is moist and without lesions. Tongue is normal in shape, size, and color and without lesions NECK: supple, trachea midline, no neck masses, no thyroid tenderness, no thyromegaly LYMPHATICS: no LAN in the neck, no supraclavicular LAN RESPIRATORY: breathing is even & unlabored, BS CTAB CARDIAC: RRR, no murmur,no extra heart sounds, no edema GI: abdomen soft, normal BS, no masses, no tenderness, no hepatomegaly, no splenomegaly EXTREMITIES:  Able to move 4 extremities PSYCHIATRIC: Confused and disoriented X 3. Affect and behavior are appropriate  LABS/RADIOLOGY: Labs reviewed: Basic Metabolic Panel:  Recent Labs  03/19/15 1133 11/03/15 1115 11/19/15 1810 11/20/15 0437  NA 141 143 139 139  K 4.3 4.2 3.8 4.0  CL 103 107 99* 104  CO2 26 27  --  29  GLUCOSE 68 107* 107* 100*  BUN 21 28* 24* 20  CREATININE 0.93 1.21* 1.10* 0.83  CALCIUM 9.0 8.8  --  8.7*  MG 2.0 2.0  --   --    Liver Function Tests:  Recent Labs  03/19/15 1133 11/03/15 1115  AST 20 17  ALT 12 11  ALKPHOS 71 68  BILITOT 0.6 0.5  PROT 6.6 6.3  ALBUMIN 3.8 3.8   CBC:  Recent Labs  03/19/15 1133 11/03/15 1115 11/19/15 1810 11/19/15 1945 11/20/15 0437  WBC 5.3 6.2  --  7.5 5.7  NEUTROABS 3.6 4,526  --  5.5  --   HGB 13.6 12.6 12.2 13.2 13.1  HCT 37.6 37.3 36.0 38.6 38.5  MCV 96.2 98.7  --  95.5 95.8  PLT 239 249  --  259 265   Lipid Panel:  Recent Labs  03/19/15 1133 11/03/15 1115  HDL 64 60      ASSESSMENT/PLAN:  L3 and multiple left ribs (9-12) fractures -  continue Norco 5/325 mg 1 tab by mouth every 6 hours when necessary for pain; fall precaution  Hypothyroidism - continue Synthroid 50 g 1 tab by mouth daily Lab Results  Component Value Date   TSH 2.27 11/03/2015   Depression - mood is stable; continue Celexa 10 mg 1 tab by mouth daily at bedtime  Vitamin D deficiency - continue vitamin D 2000 units by mouth twice a  day  Dementia - advanced; continue supportive care and fall precaution  Protein-calorie malnutrition, severe - continue med pass 90 ml PO BID    Goals of care:  Long-term care/Hospice [place], NP [place] [phone]

## 2016-01-31 DIAGNOSIS — I4891 Unspecified atrial fibrillation: Secondary | ICD-10-CM | POA: Diagnosis not present

## 2016-01-31 DIAGNOSIS — I10 Essential (primary) hypertension: Secondary | ICD-10-CM | POA: Diagnosis not present

## 2016-01-31 DIAGNOSIS — F339 Major depressive disorder, recurrent, unspecified: Secondary | ICD-10-CM | POA: Diagnosis not present

## 2016-01-31 DIAGNOSIS — E46 Unspecified protein-calorie malnutrition: Secondary | ICD-10-CM | POA: Diagnosis not present

## 2016-01-31 DIAGNOSIS — E039 Hypothyroidism, unspecified: Secondary | ICD-10-CM | POA: Diagnosis not present

## 2016-01-31 DIAGNOSIS — G309 Alzheimer's disease, unspecified: Secondary | ICD-10-CM | POA: Diagnosis not present

## 2016-02-02 DIAGNOSIS — I10 Essential (primary) hypertension: Secondary | ICD-10-CM | POA: Diagnosis not present

## 2016-02-02 DIAGNOSIS — E039 Hypothyroidism, unspecified: Secondary | ICD-10-CM | POA: Diagnosis not present

## 2016-02-02 DIAGNOSIS — G309 Alzheimer's disease, unspecified: Secondary | ICD-10-CM | POA: Diagnosis not present

## 2016-02-02 DIAGNOSIS — I4891 Unspecified atrial fibrillation: Secondary | ICD-10-CM | POA: Diagnosis not present

## 2016-02-02 DIAGNOSIS — F339 Major depressive disorder, recurrent, unspecified: Secondary | ICD-10-CM | POA: Diagnosis not present

## 2016-02-02 DIAGNOSIS — E46 Unspecified protein-calorie malnutrition: Secondary | ICD-10-CM | POA: Diagnosis not present

## 2016-02-04 DIAGNOSIS — I10 Essential (primary) hypertension: Secondary | ICD-10-CM | POA: Diagnosis not present

## 2016-02-04 DIAGNOSIS — I4891 Unspecified atrial fibrillation: Secondary | ICD-10-CM | POA: Diagnosis not present

## 2016-02-04 DIAGNOSIS — F339 Major depressive disorder, recurrent, unspecified: Secondary | ICD-10-CM | POA: Diagnosis not present

## 2016-02-04 DIAGNOSIS — E46 Unspecified protein-calorie malnutrition: Secondary | ICD-10-CM | POA: Diagnosis not present

## 2016-02-04 DIAGNOSIS — G309 Alzheimer's disease, unspecified: Secondary | ICD-10-CM | POA: Diagnosis not present

## 2016-02-04 DIAGNOSIS — E039 Hypothyroidism, unspecified: Secondary | ICD-10-CM | POA: Diagnosis not present

## 2016-02-06 DIAGNOSIS — F339 Major depressive disorder, recurrent, unspecified: Secondary | ICD-10-CM | POA: Diagnosis not present

## 2016-02-06 DIAGNOSIS — G309 Alzheimer's disease, unspecified: Secondary | ICD-10-CM | POA: Diagnosis not present

## 2016-02-06 DIAGNOSIS — I10 Essential (primary) hypertension: Secondary | ICD-10-CM | POA: Diagnosis not present

## 2016-02-06 DIAGNOSIS — E039 Hypothyroidism, unspecified: Secondary | ICD-10-CM | POA: Diagnosis not present

## 2016-02-06 DIAGNOSIS — I4891 Unspecified atrial fibrillation: Secondary | ICD-10-CM | POA: Diagnosis not present

## 2016-02-06 DIAGNOSIS — E46 Unspecified protein-calorie malnutrition: Secondary | ICD-10-CM | POA: Diagnosis not present

## 2016-02-07 DIAGNOSIS — I10 Essential (primary) hypertension: Secondary | ICD-10-CM | POA: Diagnosis not present

## 2016-02-07 DIAGNOSIS — G309 Alzheimer's disease, unspecified: Secondary | ICD-10-CM | POA: Diagnosis not present

## 2016-02-07 DIAGNOSIS — I4891 Unspecified atrial fibrillation: Secondary | ICD-10-CM | POA: Diagnosis not present

## 2016-02-07 DIAGNOSIS — E46 Unspecified protein-calorie malnutrition: Secondary | ICD-10-CM | POA: Diagnosis not present

## 2016-02-07 DIAGNOSIS — F339 Major depressive disorder, recurrent, unspecified: Secondary | ICD-10-CM | POA: Diagnosis not present

## 2016-02-07 DIAGNOSIS — E039 Hypothyroidism, unspecified: Secondary | ICD-10-CM | POA: Diagnosis not present

## 2016-02-09 DIAGNOSIS — E46 Unspecified protein-calorie malnutrition: Secondary | ICD-10-CM | POA: Diagnosis not present

## 2016-02-09 DIAGNOSIS — I10 Essential (primary) hypertension: Secondary | ICD-10-CM | POA: Diagnosis not present

## 2016-02-09 DIAGNOSIS — E039 Hypothyroidism, unspecified: Secondary | ICD-10-CM | POA: Diagnosis not present

## 2016-02-09 DIAGNOSIS — I4891 Unspecified atrial fibrillation: Secondary | ICD-10-CM | POA: Diagnosis not present

## 2016-02-09 DIAGNOSIS — G309 Alzheimer's disease, unspecified: Secondary | ICD-10-CM | POA: Diagnosis not present

## 2016-02-09 DIAGNOSIS — F339 Major depressive disorder, recurrent, unspecified: Secondary | ICD-10-CM | POA: Diagnosis not present

## 2016-02-10 DIAGNOSIS — E039 Hypothyroidism, unspecified: Secondary | ICD-10-CM | POA: Diagnosis not present

## 2016-02-10 DIAGNOSIS — I10 Essential (primary) hypertension: Secondary | ICD-10-CM | POA: Diagnosis not present

## 2016-02-10 DIAGNOSIS — E46 Unspecified protein-calorie malnutrition: Secondary | ICD-10-CM | POA: Diagnosis not present

## 2016-02-10 DIAGNOSIS — I4891 Unspecified atrial fibrillation: Secondary | ICD-10-CM | POA: Diagnosis not present

## 2016-02-10 DIAGNOSIS — F339 Major depressive disorder, recurrent, unspecified: Secondary | ICD-10-CM | POA: Diagnosis not present

## 2016-02-10 DIAGNOSIS — G309 Alzheimer's disease, unspecified: Secondary | ICD-10-CM | POA: Diagnosis not present

## 2016-02-11 DIAGNOSIS — I4891 Unspecified atrial fibrillation: Secondary | ICD-10-CM | POA: Diagnosis not present

## 2016-02-11 DIAGNOSIS — G309 Alzheimer's disease, unspecified: Secondary | ICD-10-CM | POA: Diagnosis not present

## 2016-02-11 DIAGNOSIS — E039 Hypothyroidism, unspecified: Secondary | ICD-10-CM | POA: Diagnosis not present

## 2016-02-11 DIAGNOSIS — E46 Unspecified protein-calorie malnutrition: Secondary | ICD-10-CM | POA: Diagnosis not present

## 2016-02-11 DIAGNOSIS — I10 Essential (primary) hypertension: Secondary | ICD-10-CM | POA: Diagnosis not present

## 2016-02-11 DIAGNOSIS — F339 Major depressive disorder, recurrent, unspecified: Secondary | ICD-10-CM | POA: Diagnosis not present

## 2016-02-12 ENCOUNTER — Ambulatory Visit: Payer: Self-pay | Admitting: Internal Medicine

## 2016-02-13 DIAGNOSIS — F339 Major depressive disorder, recurrent, unspecified: Secondary | ICD-10-CM | POA: Diagnosis not present

## 2016-02-13 DIAGNOSIS — E46 Unspecified protein-calorie malnutrition: Secondary | ICD-10-CM | POA: Diagnosis not present

## 2016-02-13 DIAGNOSIS — I10 Essential (primary) hypertension: Secondary | ICD-10-CM | POA: Diagnosis not present

## 2016-02-13 DIAGNOSIS — G309 Alzheimer's disease, unspecified: Secondary | ICD-10-CM | POA: Diagnosis not present

## 2016-02-13 DIAGNOSIS — I4891 Unspecified atrial fibrillation: Secondary | ICD-10-CM | POA: Diagnosis not present

## 2016-02-13 DIAGNOSIS — E039 Hypothyroidism, unspecified: Secondary | ICD-10-CM | POA: Diagnosis not present

## 2016-02-16 DIAGNOSIS — G309 Alzheimer's disease, unspecified: Secondary | ICD-10-CM | POA: Diagnosis not present

## 2016-02-16 DIAGNOSIS — E039 Hypothyroidism, unspecified: Secondary | ICD-10-CM | POA: Diagnosis not present

## 2016-02-16 DIAGNOSIS — E46 Unspecified protein-calorie malnutrition: Secondary | ICD-10-CM | POA: Diagnosis not present

## 2016-02-16 DIAGNOSIS — I4891 Unspecified atrial fibrillation: Secondary | ICD-10-CM | POA: Diagnosis not present

## 2016-02-16 DIAGNOSIS — I10 Essential (primary) hypertension: Secondary | ICD-10-CM | POA: Diagnosis not present

## 2016-02-16 DIAGNOSIS — F339 Major depressive disorder, recurrent, unspecified: Secondary | ICD-10-CM | POA: Diagnosis not present

## 2016-02-17 DIAGNOSIS — I4891 Unspecified atrial fibrillation: Secondary | ICD-10-CM | POA: Diagnosis not present

## 2016-02-17 DIAGNOSIS — G309 Alzheimer's disease, unspecified: Secondary | ICD-10-CM | POA: Diagnosis not present

## 2016-02-17 DIAGNOSIS — E46 Unspecified protein-calorie malnutrition: Secondary | ICD-10-CM | POA: Diagnosis not present

## 2016-02-17 DIAGNOSIS — E039 Hypothyroidism, unspecified: Secondary | ICD-10-CM | POA: Diagnosis not present

## 2016-02-17 DIAGNOSIS — F339 Major depressive disorder, recurrent, unspecified: Secondary | ICD-10-CM | POA: Diagnosis not present

## 2016-02-17 DIAGNOSIS — I10 Essential (primary) hypertension: Secondary | ICD-10-CM | POA: Diagnosis not present

## 2016-02-18 DIAGNOSIS — I4891 Unspecified atrial fibrillation: Secondary | ICD-10-CM | POA: Diagnosis not present

## 2016-02-18 DIAGNOSIS — F339 Major depressive disorder, recurrent, unspecified: Secondary | ICD-10-CM | POA: Diagnosis not present

## 2016-02-18 DIAGNOSIS — E039 Hypothyroidism, unspecified: Secondary | ICD-10-CM | POA: Diagnosis not present

## 2016-02-18 DIAGNOSIS — E46 Unspecified protein-calorie malnutrition: Secondary | ICD-10-CM | POA: Diagnosis not present

## 2016-02-18 DIAGNOSIS — G309 Alzheimer's disease, unspecified: Secondary | ICD-10-CM | POA: Diagnosis not present

## 2016-02-18 DIAGNOSIS — I10 Essential (primary) hypertension: Secondary | ICD-10-CM | POA: Diagnosis not present

## 2016-02-24 ENCOUNTER — Encounter: Payer: Self-pay | Admitting: Adult Health

## 2016-02-24 ENCOUNTER — Non-Acute Institutional Stay (SKILLED_NURSING_FACILITY): Payer: Medicare Other | Admitting: Adult Health

## 2016-02-24 DIAGNOSIS — E559 Vitamin D deficiency, unspecified: Secondary | ICD-10-CM

## 2016-02-24 DIAGNOSIS — E039 Hypothyroidism, unspecified: Secondary | ICD-10-CM

## 2016-02-24 DIAGNOSIS — F03C0 Unspecified dementia, severe, without behavioral disturbance, psychotic disturbance, mood disturbance, and anxiety: Secondary | ICD-10-CM

## 2016-02-24 DIAGNOSIS — F0390 Unspecified dementia without behavioral disturbance: Secondary | ICD-10-CM

## 2016-02-24 DIAGNOSIS — F329 Major depressive disorder, single episode, unspecified: Secondary | ICD-10-CM | POA: Diagnosis not present

## 2016-02-24 DIAGNOSIS — S32038S Other fracture of third lumbar vertebra, sequela: Secondary | ICD-10-CM

## 2016-02-24 DIAGNOSIS — E43 Unspecified severe protein-calorie malnutrition: Secondary | ICD-10-CM

## 2016-02-24 NOTE — Progress Notes (Signed)
Patient ID: Amber Chan, female   DOB: 1934-11-30, 81 y.o.   MRN: [id number]    DATE:    02/24/16  MRN:  [id number]  BIRTHDAY: [birth year]  Facility:  Nursing Home Location:  [place] Room Number: [id number]  LEVEL OF CARE:  SNF [phone])  Contact Information    Name Relation Home Work Mobile   League,Stanley Spouse [address] [phone]  [phone]   [place] [id number]  [phone]       Code Status History    Date Active Date Inactive Code Status Order ID Comments User Context   11/21/2015 12:10 PM 11/21/2015  4:49 PM DNR [id number]  Verlee Monte, MD Inpatient   11/20/2015 12:05 AM 11/21/2015 12:10 PM Full Code [id number]  Karmen Bongo, MD Inpatient   08/28/2013  2:46 PM 08/29/2013  2:19 PM Full Code [id number]  Evans Lance, MD Inpatient   08/26/2013  1:55 AM 08/28/2013  2:46 PM Full Code [id number]  Phillips Grout, MD Inpatient    Questions for Most Recent Historical Code Status (Order [id number])    Question Answer Comment   In the event of cardiac or respiratory ARREST Do not call a "code blue"    In the event of cardiac or respiratory ARREST Do not perform Intubation, CPR, defibrillation or ACLS    In the event of cardiac or respiratory ARREST Use medication by any route, position, wound care, and other measures to relive pain and suffering. May use oxygen, suction and manual treatment of airway obstruction as needed for comfort.         Advance Directive Documentation   Flowsheet Row Most Recent Value  Type of Advance Directive  Out of facility DNR (pink MOST or yellow form)  Pre-existing out of facility DNR order (yellow form or pink MOST form)  No data  "MOST" Form in Place?  No data       Chief Complaint  Patient presents with  . Medical Management of Chronic Issues    HISTORY OF PRESENT ILLNESS:  This is a 81 year old female is being seen for a routine visit. She is a  long-term resident of [facility]. She was seen sitting in the dining room. She was stable for the past month. Discussed with Hospice nurse regarding lab test to be done. She has agreed for the tests to be done to evaluate need for medications and condition of patient.    PAST MEDICAL HISTORY:  Past Medical History:  Diagnosis Date  . Atrial flutter ([place])   . Breast cancer ([place]) 1970   Right  . Dementia    moderate  . Hypothyroid   . IBS (irritable bowel syndrome)   . Labile hypertension   . Vitamin D deficiency      CURRENT MEDICATIONS: Reviewed  Patient's Medications  New Prescriptions   No medications on file  Previous Medications   ASPIRIN EC 81 MG TABLET    Take 81 mg by mouth every evening.   CHOLECALCIFEROL (VITAMIN D) 2000 UNITS CAPS    Take 1 capsule by mouth 2 (two) times daily.   CITALOPRAM (CELEXA) 10 MG TABLET    Take 10 mg by mouth at bedtime.    HYDROCODONE-ACETAMINOPHEN (NORCO/VICODIN) 5-325 MG TABLET    Take 1 tablet by mouth every 6 (six) hours as needed for moderate pain. DO NOT EXCEED 4GM OF APAP IN 24 HOURS FROM ALL SOURCES   LEVOTHYROXINE (  SYNTHROID, LEVOTHROID) 50 MCG TABLET    Take 50 mcg by mouth daily before breakfast.    NUTRITIONAL SUPPLEMENTS (NUTRITIONAL SUPPLEMENT PO)    Take 90 mLs by mouth 2 (two) times daily. MedPass   OYSTER SHELL (OYSTER CALCIUM) 500 MG TABS TABLET    Take 500 mg of elemental calcium by mouth at bedtime.   Modified Medications   No medications on file  Discontinued Medications   CHOLECALCIFEROL (VITAMIN D) 1000 UNITS TABLET    Take 2,000 Units by mouth 2 (two) times daily.     No Known Allergies   REVIEW OF SYSTEMS:  Unable to obtain due to advanced dementia    PHYSICAL EXAMINATION  GENERAL APPEARANCE:  In no acute distress.  SKIN:  Skin is warm and dry. HEAD: Normal in size and contour. No evidence of trauma EYES: Lids open and close normally. No blepharitis, entropion or ectropion. PERRL. Conjunctivae are clear  and sclerae are white. Lenses are without opacity EARS: Pinnae are normal. Patient hears normal voice tunes of the examiner MOUTH and THROAT: Lips are without lesions. Oral mucosa is moist and without lesions. Tongue is normal in shape, size, and color and without lesions NECK: supple, trachea midline, no neck masses, no thyroid tenderness, no thyromegaly LYMPHATICS: no LAN in the neck, no supraclavicular LAN RESPIRATORY: breathing is even & unlabored, BS CTAB CARDIAC: RRR, no murmur,no extra heart sounds, no edema GI: abdomen soft, normal BS, no masses, no tenderness, no hepatomegaly, no splenomegaly EXTREMITIES:  Able to move 4 extremities PSYCHIATRIC: Confused and disoriented X 3. Affect and behavior are appropriate  LABS/RADIOLOGY: Labs reviewed: Basic Metabolic Panel:  Recent Labs  03/19/15 1133 11/03/15 1115 11/19/15 1810 11/20/15 0437  NA 141 143 139 139  K 4.3 4.2 3.8 4.0  CL 103 107 99* 104  CO2 26 27  --  29  GLUCOSE 68 107* 107* 100*  BUN 21 28* 24* 20  CREATININE 0.93 1.21* 1.10* 0.83  CALCIUM 9.0 8.8  --  8.7*  MG 2.0 2.0  --   --    Liver Function Tests:  Recent Labs  03/19/15 1133 11/03/15 1115  AST 20 17  ALT 12 11  ALKPHOS 71 68  BILITOT 0.6 0.5  PROT 6.6 6.3  ALBUMIN 3.8 3.8   CBC:  Recent Labs  03/19/15 1133 11/03/15 1115 11/19/15 1810 11/19/15 1945 11/20/15 0437  WBC 5.3 6.2  --  7.5 5.7  NEUTROABS 3.6 4,526  --  5.5  --   HGB 13.6 12.6 12.2 13.2 13.1  HCT 37.6 37.3 36.0 38.6 38.5  MCV 96.2 98.7  --  95.5 95.8  PLT 239 249  --  259 265   Lipid Panel:  Recent Labs  03/19/15 1133 11/03/15 1115  HDL 64 60      ASSESSMENT/PLAN:  L3 and multiple left ribs (9-12) fractures -  continue Norco 5/325 mg 1 tab by mouth every 6 hours when necessary for pain; fall precaution  Hypothyroidism - continue Synthroid 50 g 1 tab by mouth daily; check tsh Lab Results  Component Value Date   TSH 2.27 11/03/2015   Depression - mood is  stable; continue Celexa 10 mg 1 tab by mouth daily at bedtime  Vitamin D deficiency - continue vitamin D 2000 units by mouth twice a day  Dementia - advanced; continue supportive care and fall precaution  Protein-calorie malnutrition, severe - had weight loss of 3.2 lbs from last month; continue med pass 90 ml PO BID;  check CBC and CMP    Goals of care:  Long-term care/Hospice [place], NP [place] [phone]

## 2016-02-25 DIAGNOSIS — I10 Essential (primary) hypertension: Secondary | ICD-10-CM | POA: Diagnosis not present

## 2016-02-25 DIAGNOSIS — M6281 Muscle weakness (generalized): Secondary | ICD-10-CM | POA: Diagnosis not present

## 2016-02-25 DIAGNOSIS — I132 Hypertensive heart and chronic kidney disease with heart failure and with stage 5 chronic kidney disease, or end stage renal disease: Secondary | ICD-10-CM | POA: Diagnosis not present

## 2016-02-25 LAB — BASIC METABOLIC PANEL
BUN: 19 mg/dL (ref 4–21)
CREATININE: 0.7 mg/dL (ref 0.5–1.1)
GLUCOSE: 98 mg/dL
Potassium: 3.9 mmol/L (ref 3.4–5.3)
Sodium: 145 mmol/L (ref 137–147)

## 2016-02-25 LAB — TSH: TSH: 7.69 u[IU]/mL — AB (ref 0.41–5.90)

## 2016-02-25 LAB — CBC AND DIFFERENTIAL
HEMATOCRIT: 34 % — AB (ref 36–46)
Hemoglobin: 12.3 g/dL (ref 12.0–16.0)
PLATELETS: 333 10*3/uL (ref 150–399)
WBC: 6.2 10*3/mL

## 2016-02-25 LAB — HEPATIC FUNCTION PANEL
ALK PHOS: 90 U/L (ref 25–125)
ALT: 11 U/L (ref 7–35)
AST: 16 U/L (ref 13–35)
BILIRUBIN, TOTAL: 0.6 mg/dL

## 2016-03-19 DIAGNOSIS — I10 Essential (primary) hypertension: Secondary | ICD-10-CM | POA: Diagnosis not present

## 2016-03-19 DIAGNOSIS — I4891 Unspecified atrial fibrillation: Secondary | ICD-10-CM | POA: Diagnosis not present

## 2016-03-19 DIAGNOSIS — E039 Hypothyroidism, unspecified: Secondary | ICD-10-CM | POA: Diagnosis not present

## 2016-03-19 DIAGNOSIS — G309 Alzheimer's disease, unspecified: Secondary | ICD-10-CM | POA: Diagnosis not present

## 2016-03-19 DIAGNOSIS — F339 Major depressive disorder, recurrent, unspecified: Secondary | ICD-10-CM | POA: Diagnosis not present

## 2016-03-19 DIAGNOSIS — E46 Unspecified protein-calorie malnutrition: Secondary | ICD-10-CM | POA: Diagnosis not present

## 2016-03-30 ENCOUNTER — Other Ambulatory Visit: Payer: Self-pay | Admitting: *Deleted

## 2016-03-30 MED ORDER — HYDROCODONE-ACETAMINOPHEN 5-325 MG PO TABS: ORAL_TABLET | ORAL | 0 refills | Status: DC

## 2016-03-30 NOTE — Telephone Encounter (Signed)
[facility] #[phone] Fax: [phone] 

## 2016-03-31 ENCOUNTER — Encounter (HOSPITAL_COMMUNITY): Payer: Self-pay | Admitting: Emergency Medicine

## 2016-03-31 ENCOUNTER — Emergency Department (HOSPITAL_COMMUNITY)
Admission: EM | Admit: 2016-03-31 | Discharge: 2016-03-31 | Disposition: A | Discharge status: Home / Self Care | Attending: Emergency Medicine | Admitting: Emergency Medicine

## 2016-03-31 ENCOUNTER — Emergency Department (HOSPITAL_COMMUNITY)

## 2016-03-31 DIAGNOSIS — I10 Essential (primary) hypertension: Secondary | ICD-10-CM | POA: Insufficient documentation

## 2016-03-31 DIAGNOSIS — S0181XA Laceration without foreign body of other part of head, initial encounter: Secondary | ICD-10-CM | POA: Diagnosis present

## 2016-03-31 DIAGNOSIS — S199XXA Unspecified injury of neck, initial encounter: Secondary | ICD-10-CM | POA: Diagnosis not present

## 2016-03-31 DIAGNOSIS — S098XXA Other specified injuries of head, initial encounter: Secondary | ICD-10-CM | POA: Diagnosis not present

## 2016-03-31 DIAGNOSIS — Y999 Unspecified external cause status: Secondary | ICD-10-CM | POA: Insufficient documentation

## 2016-03-31 DIAGNOSIS — S01111A Laceration without foreign body of right eyelid and periocular area, initial encounter: Secondary | ICD-10-CM | POA: Diagnosis not present

## 2016-03-31 DIAGNOSIS — Z7982 Long term (current) use of aspirin: Secondary | ICD-10-CM | POA: Insufficient documentation

## 2016-03-31 DIAGNOSIS — Z79899 Other long term (current) drug therapy: Secondary | ICD-10-CM | POA: Diagnosis not present

## 2016-03-31 DIAGNOSIS — E039 Hypothyroidism, unspecified: Secondary | ICD-10-CM | POA: Diagnosis not present

## 2016-03-31 DIAGNOSIS — Y9289 Other specified places as the place of occurrence of the external cause: Secondary | ICD-10-CM | POA: Insufficient documentation

## 2016-03-31 DIAGNOSIS — W050XXA Fall from non-moving wheelchair, initial encounter: Secondary | ICD-10-CM | POA: Insufficient documentation

## 2016-03-31 DIAGNOSIS — S0990XA Unspecified injury of head, initial encounter: Secondary | ICD-10-CM | POA: Diagnosis not present

## 2016-03-31 DIAGNOSIS — Z853 Personal history of malignant neoplasm of breast: Secondary | ICD-10-CM | POA: Diagnosis not present

## 2016-03-31 DIAGNOSIS — Y939 Activity, unspecified: Secondary | ICD-10-CM | POA: Insufficient documentation

## 2016-03-31 DIAGNOSIS — S0091XA Abrasion of unspecified part of head, initial encounter: Secondary | ICD-10-CM | POA: Diagnosis not present

## 2016-03-31 MED ORDER — LIDOCAINE-EPINEPHRINE-TETRACAINE (LET) SOLUTION
3.0000 mL | Freq: Once | NASAL | Status: AC
  Administered 2016-03-31: 3 mL via TOPICAL
  Filled 2016-03-31: qty 3

## 2016-03-31 MED ORDER — LIDOCAINE-EPINEPHRINE (PF) 2 %-1:200000 IJ SOLN
20.0000 mL | Freq: Once | INTRAMUSCULAR | Status: AC
  Administered 2016-03-31: 20 mL
  Filled 2016-03-31: qty 20

## 2016-03-31 NOTE — ED Triage Notes (Signed)
Per EMS patient comes from [facility] for witnessed fall.  Staff had transferred patient to wheelchair so they could clean up patient's bed. Patient stood up and fell while staff was cleaning up the bed. Patient has 2 inch laceration to forehead.   Patient has dementia and Is non verbal per EMS.

## 2016-03-31 NOTE — ED Notes (Signed)
[facility] notified of transport back to facility

## 2016-03-31 NOTE — ED Notes (Signed)
[place], [phone], aware that patient here for fall.

## 2016-03-31 NOTE — Discharge Instructions (Signed)
Please have sutures removed in 3-5 days. There are 3 in total.  Watch for signs of infection, including fever, increased redness/swelling, pus drainage, or any other symptoms concerning to you.  Return for worsening symptoms, including worsening confusion, concern for wound infection, or any other symptoms concerning to you.

## 2016-03-31 NOTE — ED Notes (Signed)
Pt is resting comfortably.  Pt is alert per norm.  Has hx of Dementia.  Waiting for [facility] for transport.

## 2016-03-31 NOTE — ED Provider Notes (Signed)
[facility] DEPT Provider Note   CSN: [id number] Arrival date & time: 03/31/16  1546     History   Chief Complaint Chief Complaint  Patient presents with  . Fall  . Head Laceration    HPI Amber Chan is a 81 y.o. female.  HPI  81 year old female who presents after fall. History of dementia and unable to provide any history. History of atrial flutter, no anticoagulation. According to her nursing facility patient had witnessed mechanical fall. Patient was standing up while wait staff was cleaning up her bed. She fell with head strike and a laceration to the right forehead. He is nonverbal at baseline, and appears to be at her baseline mental status here. She has no complaints at this time.  Past Medical History:  Diagnosis Date  . Atrial flutter ([place])   . Breast cancer ([place]) 1970   Right  . Dementia    moderate  . Hypothyroid   . IBS (irritable bowel syndrome)   . Labile hypertension   . Vitamin D deficiency     Patient Active Problem List   Diagnosis Date Noted  . L3 vertebral fracture ([place]) 11/20/2015  . Multiple rib fractures 11/19/2015  . BMI less than 19,adult 03/19/2015  . Atrial flutter ([place]) 09/27/2013  . Pericardial effusion 09/27/2013  . Vitamin D deficiency 08/13/2013  . Prediabetes 08/13/2013  . Hyperlipidemia 08/13/2013  . Medication management 08/13/2013  . SDAT (senile dementia of Alzheimer's type) 08/13/2013  . Labile hypertension   . Hypothyroid   . IBS (irritable bowel syndrome)   . Breast cancer [facility])     Past Surgical History:  Procedure Laterality Date  . ABLATION  08-28-2013   RFCA ablation of atrial flutter by Dr Lovena Le  . ATRIAL FLUTTER ABLATION N/A 08/28/2013   Procedure: ATRIAL FLUTTER ABLATION;  Surgeon: Evans Lance, MD;  Location: [facility] CATH LAB;  Service: Cardiovascular;  Laterality: N/A;  . BIOPSY THYROID     1999  . CHOLECYSTECTOMY     1988  . MASTECTOMY     right 1978 left 1980    OB History    No data  available       Home Medications    Prior to Admission medications   Medication Sig Start Date End Date Taking? Authorizing Provider  aspirin EC 81 MG tablet Take 81 mg by mouth every evening.   Yes Historical Provider, MD  Cholecalciferol (VITAMIN D) 2000 units CAPS Take 1 capsule by mouth 2 (two) times daily.   Yes Historical Provider, MD  citalopram (CELEXA) 10 MG tablet Take 10 mg by mouth at bedtime.    Yes Historical Provider, MD  HYDROcodone-acetaminophen (NORCO/VICODIN) 5-325 MG tablet Take one tablet by mouth twice daily scheduled for pain; Take one tablet by mouth every 6 hours as needed for moderate pain. DNE 3gm of Tylenol in 24 hours 03/30/16  Yes Lauree Chandler, NP  levothyroxine (SYNTHROID, LEVOTHROID) 50 MCG tablet Take 50 mcg by mouth daily before breakfast.    Yes Historical Provider, MD  Loma [place] (OYSTER CALCIUM) 500 MG TABS tablet Take 500 mg of elemental calcium by mouth at bedtime.    Yes Historical Provider, MD  Nutritional Supplements (NUTRITIONAL SUPPLEMENT PO) Take 90 mLs by mouth 2 (two) times daily. MedPass    Historical Provider, MD    Family History Family History  Problem Relation Age of Onset  . Hypertension Mother   . Cancer Mother     colon  . Stroke Father   .  Heart disease Father   . Diabetes Brother     Social History Social History  Substance Use Topics  . Smoking status: Never Smoker  . Smokeless tobacco: Never Used  . Alcohol use No     Allergies   Patient has no known allergies.   Review of Systems Review of Systems 10/14 systems reviewed and are negative other than those stated in the HPI   Physical Exam Updated Vital Signs BP 154/96   Pulse 77   Temp 97.6 F (36.4 C) (Oral)   Resp 16   SpO2 100%   Physical Exam Physical Exam  Nursing note and vitals reviewed. Constitutional: Non-toxic, and in no acute distress Head: Normocephalic. 2 cm laceration lateral to the right eyebrow  Mouth/Throat: Oropharynx is  clear and moist.  Neck: Normal range of motion. Neck supple. no neck tenderness to palpation Cardiovascular: Normal rate and regular rhythm.  no edema Pulmonary/Chest: Effort normal and breath sounds normal.  Abdominal: Soft. There is no tenderness. There is no rebound and no guarding.  Musculoskeletal: Normal range of motion of all extremities. No deformities.  Neurological: Alert, no facial droop, moves all extremities symmetrically Skin: Skin is warm and dry.  Psychiatric: Cooperative   ED Treatments / Results  Labs (all labs ordered are listed, but only abnormal results are displayed) Labs Reviewed - No data to display  EKG  EKG Interpretation None       Radiology Ct Head Wo Contrast  Result Date: 03/31/2016 CLINICAL DATA:  Fall EXAM: CT HEAD WITHOUT CONTRAST CT CERVICAL SPINE WITHOUT CONTRAST TECHNIQUE: Multidetector CT imaging of the head and cervical spine was performed following the standard protocol without intravenous contrast. Multiplanar CT image reconstructions of the cervical spine were also generated. COMPARISON:  None. FINDINGS: CT HEAD FINDINGS The examination is degraded by motion. Brain: No mass lesion, intraparenchymal hemorrhage or extra-axial collection. No evidence of acute cortical infarct. There is mildly age advanced atrophy. There is periventricular hypoattenuation compatible with chronic microvascular disease. There is focal calcification noted in the left occipital lobe. Vascular: No hyperdense vessel or unexpected calcification. Skull: Normal visualized skull base, calvarium and extracranial soft tissues. Sinuses/Orbits: No sinus fluid levels or advanced mucosal thickening. No mastoid effusion. Normal orbits. CT CERVICAL SPINE FINDINGS Alignment: No static subluxation. Facets are aligned. Occipital condyles are normally positioned. Skull base and vertebrae: No acute fracture. Soft tissues and spinal canal: No prevertebral fluid or swelling. No visible canal  hematoma. Disc levels: No advanced spinal canal or neural foraminal stenosis. Upper chest: No pneumothorax, pulmonary nodule or pleural effusion. Other: Normal visualized paraspinal cervical soft tissues. IMPRESSION: 1. Motion degraded head CT. Within that limitation, no acute intracranial abnormality. 2. Mildly age advanced cerebral atrophy. 3. No acute fracture or static subluxation of the cervical spine. Electronically Signed   By: Ulyses Jarred M.D.   On: 03/31/2016 16:58   Ct Cervical Spine Wo Contrast  Result Date: 03/31/2016 CLINICAL DATA:  Fall EXAM: CT HEAD WITHOUT CONTRAST CT CERVICAL SPINE WITHOUT CONTRAST TECHNIQUE: Multidetector CT imaging of the head and cervical spine was performed following the standard protocol without intravenous contrast. Multiplanar CT image reconstructions of the cervical spine were also generated. COMPARISON:  None. FINDINGS: CT HEAD FINDINGS The examination is degraded by motion. Brain: No mass lesion, intraparenchymal hemorrhage or extra-axial collection. No evidence of acute cortical infarct. There is mildly age advanced atrophy. There is periventricular hypoattenuation compatible with chronic microvascular disease. There is focal calcification noted in the left occipital lobe.  Vascular: No hyperdense vessel or unexpected calcification. Skull: Normal visualized skull base, calvarium and extracranial soft tissues. Sinuses/Orbits: No sinus fluid levels or advanced mucosal thickening. No mastoid effusion. Normal orbits. CT CERVICAL SPINE FINDINGS Alignment: No static subluxation. Facets are aligned. Occipital condyles are normally positioned. Skull base and vertebrae: No acute fracture. Soft tissues and spinal canal: No prevertebral fluid or swelling. No visible canal hematoma. Disc levels: No advanced spinal canal or neural foraminal stenosis. Upper chest: No pneumothorax, pulmonary nodule or pleural effusion. Other: Normal visualized paraspinal cervical soft tissues.  IMPRESSION: 1. Motion degraded head CT. Within that limitation, no acute intracranial abnormality. 2. Mildly age advanced cerebral atrophy. 3. No acute fracture or static subluxation of the cervical spine. Electronically Signed   By: Ulyses Jarred M.D.   On: 03/31/2016 16:58    Procedures .Marland KitchenLaceration Repair Date/Time: 03/31/2016 5:36 PM Performed by: Brantley Stage DUO Authorized by: Brantley Stage DUO   Consent:    Consent obtained:  Verbal   Consent given by:  Spouse   Risks discussed:  Infection, pain and need for additional repair   Alternatives discussed:  No treatment Laceration details:    Location:  Face   Face location:  R eyebrow   Length (cm):  2 Pre-procedure details:    Preparation:  Patient was prepped and draped in usual sterile fashion Exploration:    Hemostasis achieved with:  Epinephrine   Wound exploration: entire depth of wound probed and visualized     Contaminated: no   Treatment:    Area cleansed with:  Betadine   Amount of cleaning:  Standard   Irrigation solution:  Tap water   Irrigation volume:  500 mL   Irrigation method:  Syringe   Visualized foreign bodies/material removed: no   Skin repair:    Repair method:  Sutures   Suture size:  5-0   Suture material:  Nylon   Number of sutures:  3 Approximation:    Approximation:  Close   Vermilion border: well-aligned   Post-procedure details:    Dressing:  Non-adherent dressing   Patient tolerance of procedure:  Tolerated well, no immediate complications   (including critical care time)  Medications Ordered in ED Medications  lidocaine-EPINEPHrine-tetracaine (LET) solution (3 mLs Topical Given 03/31/16 1658)  lidocaine-EPINEPHrine (XYLOCAINE W/EPI) 2 %-1:200000 (PF) injection 20 mL (20 mLs Infiltration Given 03/31/16 1700)     Initial Impression / Assessment and Plan / ED Course  I have reviewed the triage vital signs and the nursing notes.  Pertinent labs & imaging results that were available during  my care of the patient were reviewed by me and considered in my medical decision making (see chart for details).  Clinical Course    Presenting after mechanical witnessed fall at nursing home w/ head laceration. Appears to be at mental status baseline in the ED. Aside from head laceration, no other injuries on exam. Will obtain CT head and c-spine. Will perform laceration repair.  CTs visualized and there are no acute intracranial or cervical spine injuries. Laceration repaired according to note above. Wound care instructions and suture removal placed in paperwork for patient's facility to reviewed.  Stable for discharge back to nursing facility.    Final Clinical Impressions(s) / ED Diagnoses   Final diagnoses:  Injury of head, initial encounter  Laceration of eyelid of right eye without foreign body, initial encounter    New Prescriptions New Prescriptions   No medications on file     Forde Dandy,  MD 03/31/16 1738

## 2016-04-01 ENCOUNTER — Encounter: Payer: Self-pay | Admitting: Internal Medicine

## 2016-04-01 ENCOUNTER — Non-Acute Institutional Stay (SKILLED_NURSING_FACILITY): Payer: Medicare Other | Admitting: Internal Medicine

## 2016-04-01 DIAGNOSIS — S32038S Other fracture of third lumbar vertebra, sequela: Secondary | ICD-10-CM

## 2016-04-01 DIAGNOSIS — F0280 Dementia in other diseases classified elsewhere without behavioral disturbance: Secondary | ICD-10-CM

## 2016-04-01 DIAGNOSIS — F329 Major depressive disorder, single episode, unspecified: Secondary | ICD-10-CM

## 2016-04-01 DIAGNOSIS — L89152 Pressure ulcer of sacral region, stage 2: Secondary | ICD-10-CM | POA: Diagnosis not present

## 2016-04-01 DIAGNOSIS — S0181XD Laceration without foreign body of other part of head, subsequent encounter: Secondary | ICD-10-CM | POA: Diagnosis not present

## 2016-04-01 DIAGNOSIS — E43 Unspecified severe protein-calorie malnutrition: Secondary | ICD-10-CM | POA: Diagnosis not present

## 2016-04-01 DIAGNOSIS — L89312 Pressure ulcer of right buttock, stage 2: Secondary | ICD-10-CM

## 2016-04-01 DIAGNOSIS — Z9181 History of falling: Secondary | ICD-10-CM | POA: Diagnosis not present

## 2016-04-01 DIAGNOSIS — E039 Hypothyroidism, unspecified: Secondary | ICD-10-CM | POA: Diagnosis not present

## 2016-04-01 DIAGNOSIS — L89321 Pressure ulcer of left buttock, stage 1: Secondary | ICD-10-CM | POA: Diagnosis not present

## 2016-04-01 DIAGNOSIS — G301 Alzheimer's disease with late onset: Secondary | ICD-10-CM

## 2016-04-01 DIAGNOSIS — F32A Depression, unspecified: Secondary | ICD-10-CM

## 2016-04-01 NOTE — Progress Notes (Signed)
LOCATION: [place]  PCP: Alesia Richards, MD   Code Status: DNR  Goals of care: Advanced Directive information Advanced Directives 03/31/2016  Does Patient Have a Medical Advance Directive? Yes  Type of Advance Directive Out of facility DNR (pink MOST or yellow form)  Does patient want to make changes to medical advance directive? -  Copy of [place] in Chart? -       Extended Emergency Contact Information Primary Emergency Contact: Hodgman,Stanley Address: [address]          [place], [place] [address] [place] Phone: [phone] Relation: Spouse Secondary Emergency Contact: Dan Humphreys Address: [address]          [place], [place] [address] [place] of [place] Phone: [phone] Mobile Phone: [phone] Relation: Nephew   No Known Allergies  Chief Complaint  Patient presents with  . Medical Management of Chronic Issues    Routine Visit     HPI:  Patient is a 81 y.o. female seen today for routine visit. She is under total care with her advanced dementia. She is currently under hospice services. she had a fall yesterday and sustained laceration to her forehead and required sutures. She is seen in her room today. She does not participate in HPI and ROS.  Review of Systems: unable to obtain   Past Medical History:  Diagnosis Date  . Atrial flutter ([place])   . Breast cancer ([place]) 1970   Right  . Dementia    moderate  . Hypothyroid   . IBS (irritable bowel syndrome)   . Labile hypertension   . Vitamin D deficiency    Past Surgical History:  Procedure Laterality Date  . ABLATION  08-28-2013   RFCA ablation of atrial flutter by Dr Lovena Le  . ATRIAL FLUTTER ABLATION N/A 08/28/2013   Procedure: ATRIAL FLUTTER ABLATION;  Surgeon: Evans Lance, MD;  Location: [facility] CATH LAB;  Service: Cardiovascular;  Laterality: N/A;  . BIOPSY THYROID     1999  . CHOLECYSTECTOMY     1988  . MASTECTOMY      right 1978 left 1980   Social History:   reports that she has never smoked. She has never used smokeless tobacco. She reports that she does not drink alcohol or use drugs.  Family History  Problem Relation Age of Onset  . Hypertension Mother   . Cancer Mother     colon  . Stroke Father   . Heart disease Father   . Diabetes Brother     Medications:   Medication List       Accurate as of 04/01/16  1:20 PM. Always use your most recent med list.          aspirin EC 81 MG tablet Take 81 mg by mouth every evening.   citalopram 10 MG tablet Commonly known as:  CELEXA Take 10 mg by mouth at bedtime.   HYDROcodone-acetaminophen 5-325 MG tablet Commonly known as:  NORCO/VICODIN Take one tablet by mouth twice daily scheduled for pain; Take one tablet by mouth every 6 hours as needed for moderate pain. DNE 3gm of Tylenol in 24 hours   levothyroxine 50 MCG tablet Commonly known as:  SYNTHROID, LEVOTHROID Take 50 mcg by mouth daily before breakfast.   NUTRITIONAL SUPPLEMENT PO Take 120 mLs by mouth 2 (two) times daily. MedPass   oyster calcium 500 MG Tabs tablet Take 500 mg of elemental calcium by mouth at bedtime.  Vitamin D 2000 units Caps Take 1 capsule by mouth 2 (two) times daily.       Immunizations: Immunization History  Administered Date(s) Administered  . Influenza Split 02/13/2013  . Influenza, High Dose Seasonal PF 02/26/2014, 03/19/2015  . Influenza-Unspecified 01/17/2013  . Pneumococcal Conjugate-13 02/18/2014  . Pneumococcal Polysaccharide-23 09/02/2008  . Td 04/20/2006     Physical Exam:  Vitals:   04/01/16 1238  BP: 130/82  Pulse: 79  Resp: 19  Temp: 98.9 F (37.2 C)  TempSrc: Oral  SpO2: 98%  Weight: 117 lb (53.1 kg)  Height: 5\' 8"  (1.727 m)   Body mass index is 17.79 kg/m.  General- elderly female, frail and thin built, in no acute distress Head- normocephalic, atraumatic Nose- no nasal discharge Throat- moist mucus  membrane Eyes- PERRLA, EOMI, no pallor, no icterus Neck- no cervical lymphadenopathy Cardiovascular- normal s1,s2, no murmur Respiratory- bilateral clear to auscultation, no wheeze, no rhonchi, no crackles, no use of accessory muscles Abdomen- bowel sounds present, soft, non tender Musculoskeletal- able to move all 4 extremities, generalized weakness, is OOB daily on wheelchair Neurological- alert and oriented to self only Skin- warm and dry, right forehead laceration with sutures in place, right periorbital bruise, stage 1 pressure ulcer to left ischium and stage 2 pressure ulcer to right ischium. Stage 2 pressure ulcer to coccyx.   Labs reviewed: Basic Metabolic Panel:  Recent Labs  11/03/15 1115 11/19/15 1810 11/20/15 0437  NA 143 139 139  K 4.2 3.8 4.0  CL 107 99* 104  CO2 27  --  29  GLUCOSE 107* 107* 100*  BUN 28* 24* 20  CREATININE 1.21* 1.10* 0.83  CALCIUM 8.8  --  8.7*  MG 2.0  --   --    Liver Function Tests:  Recent Labs  11/03/15 1115  AST 17  ALT 11  ALKPHOS 68  BILITOT 0.5  PROT 6.3  ALBUMIN 3.8   No results for input(s): LIPASE, AMYLASE in the last 8760 hours. No results for input(s): AMMONIA in the last 8760 hours. CBC:  Recent Labs  11/03/15 1115 11/19/15 1810 11/19/15 1945 11/20/15 0437  WBC 6.2  --  7.5 5.7  NEUTROABS 4,526  --  5.5  --   HGB 12.6 12.2 13.2 13.1  HCT 37.3 36.0 38.6 38.5  MCV 98.7  --  95.5 95.8  PLT 249  --  259 265    Radiological Exams: Ct Head Wo Contrast  Result Date: 03/31/2016 CLINICAL DATA:  Fall EXAM: CT HEAD WITHOUT CONTRAST CT CERVICAL SPINE WITHOUT CONTRAST TECHNIQUE: Multidetector CT imaging of the head and cervical spine was performed following the standard protocol without intravenous contrast. Multiplanar CT image reconstructions of the cervical spine were also generated. COMPARISON:  None. FINDINGS: CT HEAD FINDINGS The examination is degraded by motion. Brain: No mass lesion, intraparenchymal  hemorrhage or extra-axial collection. No evidence of acute cortical infarct. There is mildly age advanced atrophy. There is periventricular hypoattenuation compatible with chronic microvascular disease. There is focal calcification noted in the left occipital lobe. Vascular: No hyperdense vessel or unexpected calcification. Skull: Normal visualized skull base, calvarium and extracranial soft tissues. Sinuses/Orbits: No sinus fluid levels or advanced mucosal thickening. No mastoid effusion. Normal orbits. CT CERVICAL SPINE FINDINGS Alignment: No static subluxation. Facets are aligned. Occipital condyles are normally positioned. Skull base and vertebrae: No acute fracture. Soft tissues and spinal canal: No prevertebral fluid or swelling. No visible canal hematoma. Disc levels: No advanced spinal canal or neural foraminal  stenosis. Upper chest: No pneumothorax, pulmonary nodule or pleural effusion. Other: Normal visualized paraspinal cervical soft tissues. IMPRESSION: 1. Motion degraded head CT. Within that limitation, no acute intracranial abnormality. 2. Mildly age advanced cerebral atrophy. 3. No acute fracture or static subluxation of the cervical spine. Electronically Signed   By: Ulyses Jarred M.D.   On: 03/31/2016 16:58   Ct Cervical Spine Wo Contrast  Result Date: 03/31/2016 CLINICAL DATA:  Fall EXAM: CT HEAD WITHOUT CONTRAST CT CERVICAL SPINE WITHOUT CONTRAST TECHNIQUE: Multidetector CT imaging of the head and cervical spine was performed following the standard protocol without intravenous contrast. Multiplanar CT image reconstructions of the cervical spine were also generated. COMPARISON:  None. FINDINGS: CT HEAD FINDINGS The examination is degraded by motion. Brain: No mass lesion, intraparenchymal hemorrhage or extra-axial collection. No evidence of acute cortical infarct. There is mildly age advanced atrophy. There is periventricular hypoattenuation compatible with chronic microvascular disease. There  is focal calcification noted in the left occipital lobe. Vascular: No hyperdense vessel or unexpected calcification. Skull: Normal visualized skull base, calvarium and extracranial soft tissues. Sinuses/Orbits: No sinus fluid levels or advanced mucosal thickening. No mastoid effusion. Normal orbits. CT CERVICAL SPINE FINDINGS Alignment: No static subluxation. Facets are aligned. Occipital condyles are normally positioned. Skull base and vertebrae: No acute fracture. Soft tissues and spinal canal: No prevertebral fluid or swelling. No visible canal hematoma. Disc levels: No advanced spinal canal or neural foraminal stenosis. Upper chest: No pneumothorax, pulmonary nodule or pleural effusion. Other: Normal visualized paraspinal cervical soft tissues. IMPRESSION: 1. Motion degraded head CT. Within that limitation, no acute intracranial abnormality. 2. Mildly age advanced cerebral atrophy. 3. No acute fracture or static subluxation of the cervical spine. Electronically Signed   By: Ulyses Jarred M.D.   On: 03/31/2016 16:58    Assessment/Plan  Pressure ulcers Has multiple stage pressure ulcer. To provide air mattress to help with pressure redistribution and to promote wound healing. Continue wound care. Add decubivite to promote wound healing. Continue norco 5-325 mg BID pain.   Right forehead laceration With sutures in place, remove sutures in 5 days. Fall precautions and monitor site for infection  Hypothyroidism Lab Results  Component Value Date   TSH 2.27 11/03/2015   Continue her levothyroxine and monitor  Alzheimer's dementia To provide supportive care. Fall precautions and pressure ulcer prophylaxis. Aspiration precautions  L3 vertebral fracture sequela Does not appear in pain. Non verbal. Currently on norco 5-325 mg bid. Change this to once a day x 1 week scheduled and twice a day as needed for severe pain only. Start tylenol 500 mg bid and monitor.   Severe Protein calorie  malnutrition Monitor po intake and weight. decline anticipated with her advanced dementia. Continue her feeding supplement  Chronic depression Patient unable to express herself, does not participate in conversation. I do not feel celexa is benefiting her at present. Discontinue her celexa and monitor  Fall risk With her recent fall and advanced dementia, she is a high fall risk. Discontinue aspirin as this will increase risk for bleed. Avoid sedative and hypnotic medication where possible   Family/ staff Communication: reviewed care plan with patient and nursing supervisor    Blanchie Serve, MD Internal Medicine [place], [place] [address] Cell Phone (Monday-Friday 8 am - 5 pm): [phone] On Call: [phone] and follow prompts after 5 pm and on weekends Office Phone: [phone] Office Fax: [phone]

## 2016-04-02 ENCOUNTER — Other Ambulatory Visit: Payer: Self-pay

## 2016-04-02 MED ORDER — HYDROCODONE-ACETAMINOPHEN 5-325 MG PO TABS: ORAL_TABLET | ORAL | 0 refills | Status: DC

## 2016-04-02 NOTE — Telephone Encounter (Signed)
Rx faxed to [facility] @ [phone], phone number [phone]  

## 2016-04-08 LAB — CBC AND DIFFERENTIAL
HEMATOCRIT: 43 % (ref 36–46)
HEMOGLOBIN: 13.6 g/dL (ref 12.0–16.0)
NEUTROS ABS: 4 /uL
PLATELETS: 271 10*3/uL (ref 150–399)
WBC: 5.9 10^3/mL

## 2016-04-08 LAB — BASIC METABOLIC PANEL
BUN: 24 mg/dL — AB (ref 4–21)
Creatinine: 0.8 mg/dL (ref 0.5–1.1)
Glucose: 102 mg/dL
Potassium: 3.9 mmol/L (ref 3.4–5.3)
SODIUM: 156 mmol/L — AB (ref 137–147)

## 2016-04-29 ENCOUNTER — Non-Acute Institutional Stay (SKILLED_NURSING_FACILITY): Payer: Medicare Other | Admitting: Adult Health

## 2016-04-29 ENCOUNTER — Encounter: Payer: Self-pay | Admitting: Adult Health

## 2016-04-29 DIAGNOSIS — K5901 Slow transit constipation: Secondary | ICD-10-CM

## 2016-04-29 DIAGNOSIS — E039 Hypothyroidism, unspecified: Secondary | ICD-10-CM

## 2016-04-29 DIAGNOSIS — F0280 Dementia in other diseases classified elsewhere without behavioral disturbance: Secondary | ICD-10-CM | POA: Diagnosis not present

## 2016-04-29 DIAGNOSIS — E870 Hyperosmolality and hypernatremia: Secondary | ICD-10-CM | POA: Diagnosis not present

## 2016-04-29 DIAGNOSIS — G3183 Dementia with Lewy bodies: Secondary | ICD-10-CM

## 2016-04-29 DIAGNOSIS — E43 Unspecified severe protein-calorie malnutrition: Secondary | ICD-10-CM

## 2016-04-29 NOTE — Progress Notes (Signed)
DATE:  04/29/2016   MRN:  [id number]  BIRTHDAY: 1935-02-20  Facility:  [facility] Home Location:  [place] Room Number: [id number]  LEVEL OF CARE:  SNF [phone])  Contact Information    Name Relation Home Work Mobile   Mannes,Stanley Spouse [place] [phone]  [phone]   [place] [phone]  [phone]       Code Status History    Date Active Date Inactive Code Status Order ID Comments User Context   11/21/2015 12:10 PM 11/21/2015  4:49 PM DNR [id number]  Verlee Monte, MD Inpatient   11/20/2015 12:05 AM 11/21/2015 12:10 PM Full Code [id number]  Karmen Bongo, MD Inpatient   08/28/2013  2:46 PM 08/29/2013  2:19 PM Full Code [id number]  Evans Lance, MD Inpatient   08/26/2013  1:55 AM 08/28/2013  2:46 PM Full Code [id number]  Phillips Grout, MD Inpatient    Questions for Most Recent Historical Code Status (Order [id number])    Question Answer Comment   In the event of cardiac or respiratory ARREST Do not call a "code blue"    In the event of cardiac or respiratory ARREST Do not perform Intubation, CPR, defibrillation or ACLS    In the event of cardiac or respiratory ARREST Use medication by any route, position, wound care, and other measures to relive pain and suffering. May use oxygen, suction and manual treatment of airway obstruction as needed for comfort.         Advance Directive Documentation   [place] Most Recent Value  Type of Advance Directive  Living will, Out of facility DNR (pink MOST or yellow form)  Pre-existing out of facility DNR order (yellow form or pink MOST form)  No data  "MOST" Form in Place?  No data       Chief Complaint  Patient presents with  . Medical Management of Chronic Issues    HISTORY OF PRESENT ILLNESS:  This is a 79-YO female seen for a routine visit.  She is a long-term care resident of [facility].  She is currently under the  care of Hospice for advanced dementia. She was recently started on Senna-S for constipation. Review of labs showed Na 156 and tsh 7.685, both elevated.    PAST MEDICAL HISTORY:  Past Medical History:  Diagnosis Date  . Atrial flutter ([place])   . Breast cancer ([place]) 1970   Right  . Dementia    moderate  . Hypothyroid   . IBS (irritable bowel syndrome)   . Labile hypertension   . Vitamin D deficiency      CURRENT MEDICATIONS: Reviewed  Patient's Medications  New Prescriptions   No medications on file  Previous Medications   ACETAMINOPHEN (TYLENOL) 500 MG TABLET    Take 500 mg by mouth 2 (two) times daily.   CHOLECALCIFEROL (VITAMIN D) 2000 UNITS CAPS    Take 1 capsule by mouth 2 (two) times daily.   HYDROCODONE-ACETAMINOPHEN (NORCO/VICODIN) 5-325 MG TABLET    Take 1 tablet by mouth every 12 (twelve) hours as needed for severe pain.   LEVOTHYROXINE (SYNTHROID, LEVOTHROID) 50 MCG TABLET    Take 50 mcg by mouth daily before breakfast.    MULTIPLE VITAMINS-MINERALS (DECUBI-VITE) CAPS    Take 1 capsule by mouth daily.   NUTRITIONAL SUPPLEMENTS (NUTRITIONAL SUPPLEMENT PO)    Take 120 mLs by mouth 3 (three) times daily. [employer]  OYSTER SHELL (OYSTER CALCIUM) 500 MG TABS TABLET    Take 500 mg of elemental calcium by mouth at bedtime.    SENNOSIDES-DOCUSATE SODIUM (SENOKOT-S) 8.6-50 MG TABLET    Take 1 tablet by mouth 2 (two) times daily.  Modified Medications   No medications on file  Discontinued Medications   ASPIRIN EC 81 MG TABLET    Take 81 mg by mouth every evening.   CITALOPRAM (CELEXA) 10 MG TABLET    Take 10 mg by mouth at bedtime.    HYDROCODONE-ACETAMINOPHEN (NORCO/VICODIN) 5-325 MG TABLET    1 by mouth daily for 7 days. DNE 4gm of Tylenol in 24 hours     No Known Allergies   REVIEW OF SYSTEMS:  Unable to obtain due to advanced dementia   PHYSICAL EXAMINATION  GENERAL APPEARANCE:  In no acute distress.  SKIN:  Skin is warm and dry.  HEAD: Normal in size and  contour. No evidence of trauma EYES: Lids open and close normally. No blepharitis, entropion or ectropion. PERRL. Conjunctivae are clear and sclerae are white. Lenses are without opacity EARS: Pinnae are normal.  MOUTH and THROAT: Lips are without lesions. Oral mucosa is moist and without lesions. Tongue is normal in shape, size, and color and without lesions NECK: supple, trachea midline, no neck masses, no thyroid tenderness, no thyromegaly LYMPHATICS: no LAN in the neck, no supraclavicular LAN RESPIRATORY: breathing is even & unlabored, BS CTAB CARDIAC: RRR, no murmur,no extra heart sounds, no edema GI: abdomen soft, normal BS, no masses, no tenderness, no hepatomegaly, no splenomegaly EXTREMITIES:  Able to move X 4 extremities; BLE PSYCHIATRIC: Confused and disoriented X 3.  Affect and behavior are appropriate  LABS/RADIOLOGY: Labs reviewed: Basic Metabolic Panel:  Recent Labs  11/03/15 1115  11/19/15 1810 11/20/15 0437 02/25/16 04/08/16  NA 143  --  139 139 145 156*  K 4.2  --  3.8 4.0 3.9 3.9  CL 107  --  99* 104  --   --   CO2 27  --   --  29  --   --   GLUCOSE 107*  --  107* 100*  --   --   BUN 28*  --  24* 20 19 24*  CREATININE 1.21*  < > 1.10* 0.83 0.7 0.8  CALCIUM 8.8  --   --  8.7*  --   --   MG 2.0  --   --   --   --   --   < > = values in this interval not displayed. Liver Function Tests:  Recent Labs  11/03/15 1115 02/25/16  AST 17 16  ALT 11 11  ALKPHOS 68 90  BILITOT 0.5  --   PROT 6.3  --   ALBUMIN 3.8  --    CBC:  Recent Labs  11/03/15 1115  11/19/15 1945 11/20/15 0437 02/25/16 04/08/16  WBC 6.2  --  7.5 5.7 6.2 5.9  NEUTROABS 4,526  --  5.5  --   --  4  HGB 12.6  < > 13.2 13.1 12.3 13.6  HCT 37.3  < > 38.6 38.5 34* 43  MCV 98.7  --  95.5 95.8  --   --   PLT 249  --  259 265 333 271  < > = values in this interval not displayed. Lipid Panel:  Recent Labs  11/03/15 1115  HDL 60     Ct Head Wo Contrast  Result Date:  03/31/2016 CLINICAL DATA:  Fall  EXAM: CT HEAD WITHOUT CONTRAST CT CERVICAL SPINE WITHOUT CONTRAST TECHNIQUE: Multidetector CT imaging of the head and cervical spine was performed following the standard protocol without intravenous contrast. Multiplanar CT image reconstructions of the cervical spine were also generated. COMPARISON:  None. FINDINGS: CT HEAD FINDINGS The examination is degraded by motion. Brain: No mass lesion, intraparenchymal hemorrhage or extra-axial collection. No evidence of acute cortical infarct. There is mildly age advanced atrophy. There is periventricular hypoattenuation compatible with chronic microvascular disease. There is focal calcification noted in the left occipital lobe. Vascular: No hyperdense vessel or unexpected calcification. Skull: Normal visualized skull base, calvarium and extracranial soft tissues. Sinuses/Orbits: No sinus fluid levels or advanced mucosal thickening. No mastoid effusion. Normal orbits. CT CERVICAL SPINE FINDINGS Alignment: No static subluxation. Facets are aligned. Occipital condyles are normally positioned. Skull base and vertebrae: No acute fracture. Soft tissues and spinal canal: No prevertebral fluid or swelling. No visible canal hematoma. Disc levels: No advanced spinal canal or neural foraminal stenosis. Upper chest: No pneumothorax, pulmonary nodule or pleural effusion. Other: Normal visualized paraspinal cervical soft tissues. IMPRESSION: 1. Motion degraded head CT. Within that limitation, no acute intracranial abnormality. 2. Mildly age advanced cerebral atrophy. 3. No acute fracture or static subluxation of the cervical spine. Electronically Signed   By: Ulyses Jarred M.D.   On: 03/31/2016 16:58   Ct Cervical Spine Wo Contrast  Result Date: 03/31/2016 CLINICAL DATA:  Fall EXAM: CT HEAD WITHOUT CONTRAST CT CERVICAL SPINE WITHOUT CONTRAST TECHNIQUE: Multidetector CT imaging of the head and cervical spine was performed following the standard  protocol without intravenous contrast. Multiplanar CT image reconstructions of the cervical spine were also generated. COMPARISON:  None. FINDINGS: CT HEAD FINDINGS The examination is degraded by motion. Brain: No mass lesion, intraparenchymal hemorrhage or extra-axial collection. No evidence of acute cortical infarct. There is mildly age advanced atrophy. There is periventricular hypoattenuation compatible with chronic microvascular disease. There is focal calcification noted in the left occipital lobe. Vascular: No hyperdense vessel or unexpected calcification. Skull: Normal visualized skull base, calvarium and extracranial soft tissues. Sinuses/Orbits: No sinus fluid levels or advanced mucosal thickening. No mastoid effusion. Normal orbits. CT CERVICAL SPINE FINDINGS Alignment: No static subluxation. Facets are aligned. Occipital condyles are normally positioned. Skull base and vertebrae: No acute fracture. Soft tissues and spinal canal: No prevertebral fluid or swelling. No visible canal hematoma. Disc levels: No advanced spinal canal or neural foraminal stenosis. Upper chest: No pneumothorax, pulmonary nodule or pleural effusion. Other: Normal visualized paraspinal cervical soft tissues. IMPRESSION: 1. Motion degraded head CT. Within that limitation, no acute intracranial abnormality. 2. Mildly age advanced cerebral atrophy. 3. No acute fracture or static subluxation of the cervical spine. Electronically Signed   By: Ulyses Jarred M.D.   On: 03/31/2016 16:58    ASSESSMENT/PLAN:  Hypernatremia - Na 156; will re-check BMP  Constipation - recently started on senna S1 tab by mouth twice a day  Hypothyroidism - increase Synthroid from 50 g to 75 g 1 tab by mouth daily; check tsh in 6 weeks Lab Results  Component Value Date   TSH 7.69 (A) 02/25/2016   Severe protein calorie malnutrition - continue med Pass 120 mL twice a day and decubivite 1 tab by mouth daily  Dementia without behavioral  disturbance - advanced; continue supportive care and fall precautions    Goals of care:  Long-term care    Seamus Warehime C. Byers - NP [employer] [phone]

## 2016-05-17 ENCOUNTER — Ambulatory Visit: Payer: Self-pay | Admitting: Internal Medicine

## 2016-05-20 DEATH — deceased

## 2016-12-01 ENCOUNTER — Other Ambulatory Visit: Payer: Self-pay | Admitting: Internal Medicine

## 2016-12-01 ENCOUNTER — Encounter: Payer: Self-pay | Admitting: Internal Medicine

## 2018-11-19 IMAGING — CT CT CERVICAL SPINE W/O CM
4 of 10 series · 10 of 33 positions shown, 11 images · non-contrast
Comparison: None.

CLINICAL DATA: Fall

EXAM:
CT HEAD WITHOUT CONTRAST
CT CERVICAL SPINE WITHOUT CONTRAST
TECHNIQUE: Multidetector CT imaging of the head and cervical spine was
performed following the standard protocol without intravenous
contrast. Multiplanar CT image reconstructions of the cervical spine
were also generated.

[Series 6: coronal · coronal · 0.33mm/px · 1 of 65 slices shown]
[im 33/65  bone]
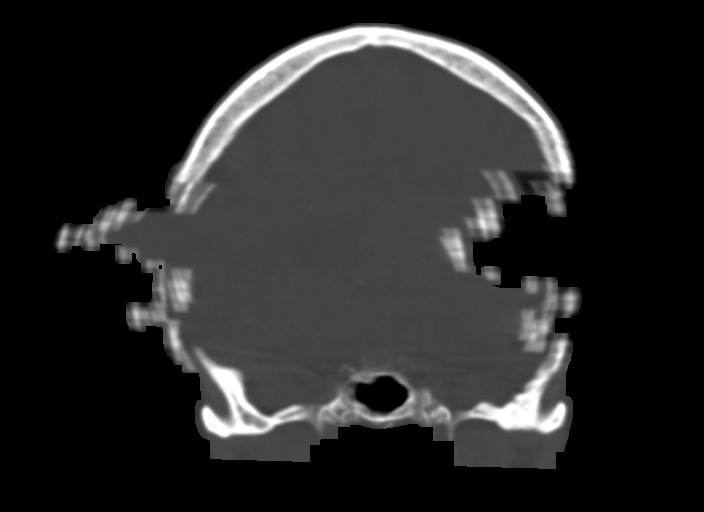

[Series 7: sagittal · sagittal · 0.34mm/px · 3 of 51 slices shown]
[im 13/51  bone]
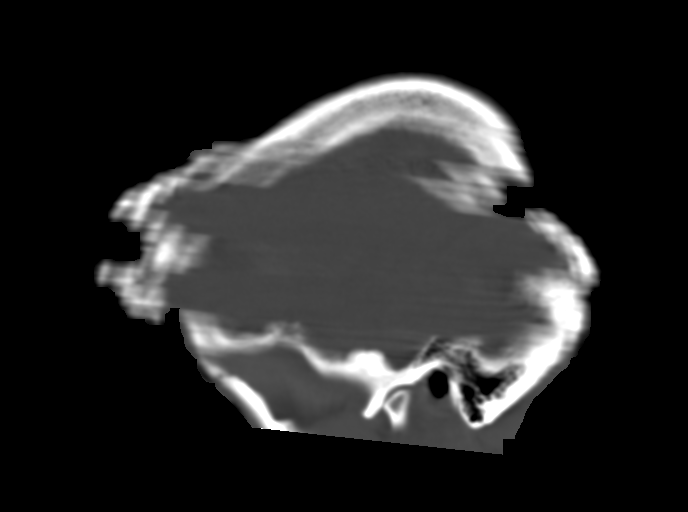
[im 26/51  bone]
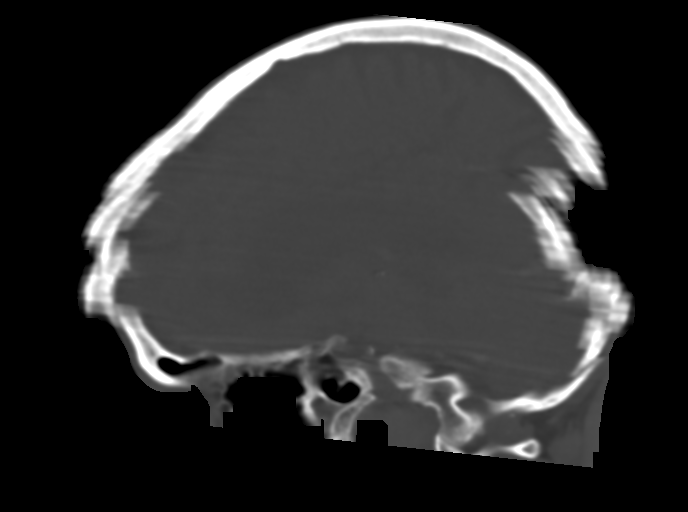
[im 38/51  bone]
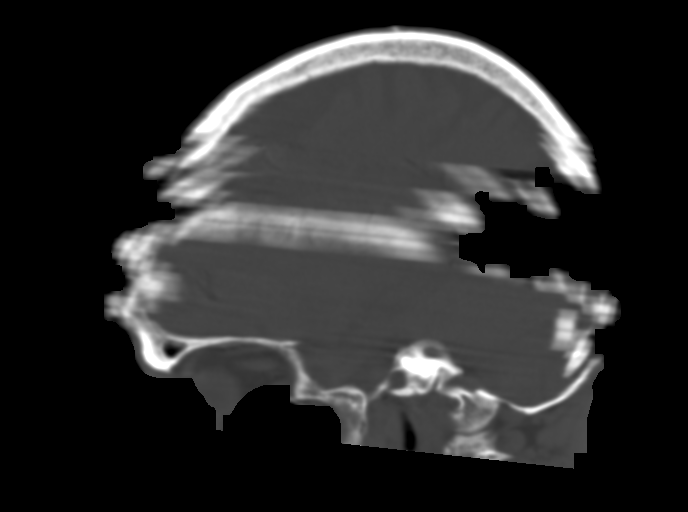

[Series 14: c-spine st · axial · 0.31mm/px · z∈[-355,-303]mm · 2 of 80 slices shown]
[im 27/80  bone]
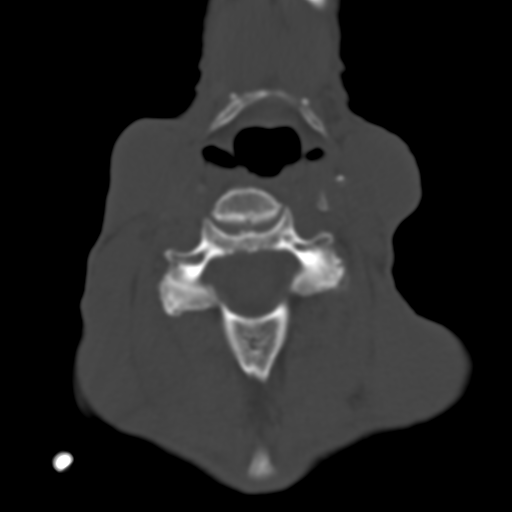
[im 53/80  bone]
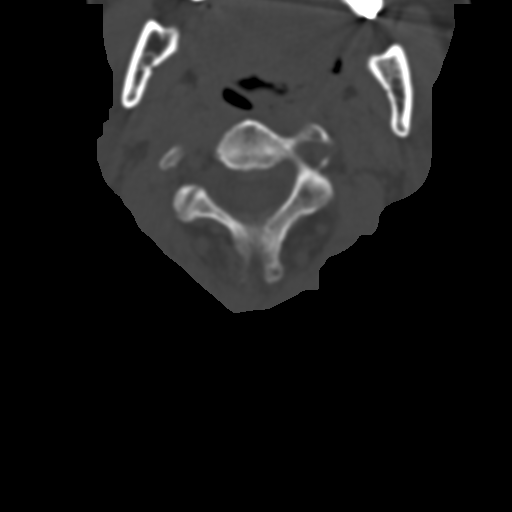

[Series 17: axial reformats · axial · 0.36mm/px · z∈[-419,-312]mm · 4 of 107 slices shown, 5 images]
[im 22/107  soft-tissue]
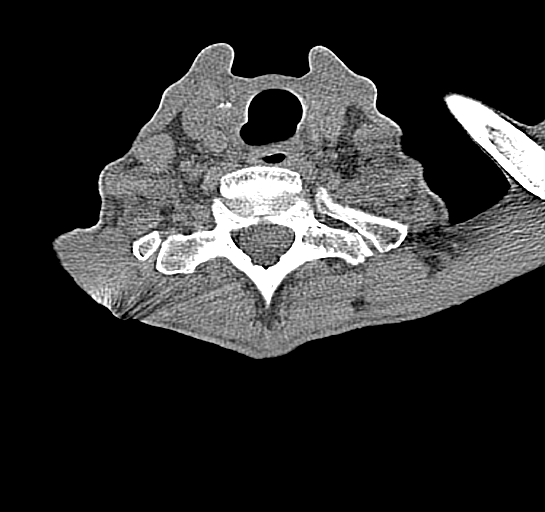
[im 22/107  bone]
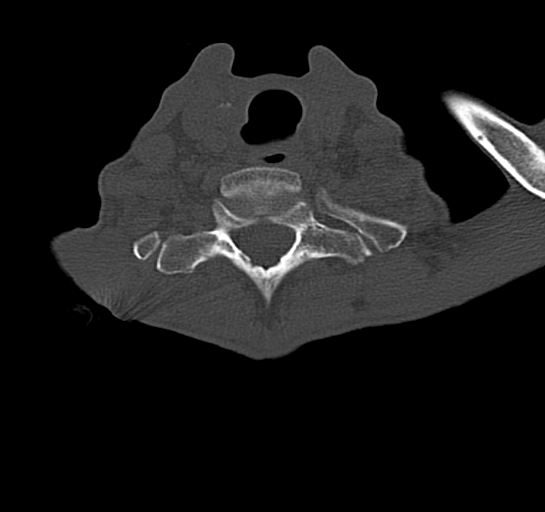
[im 43/107  bone]
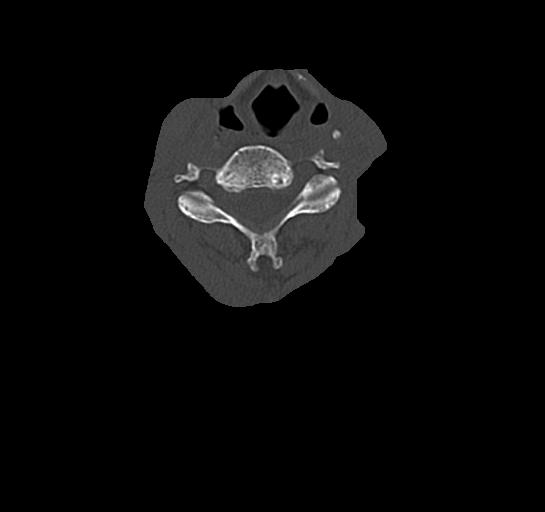
[im 64/107  bone]
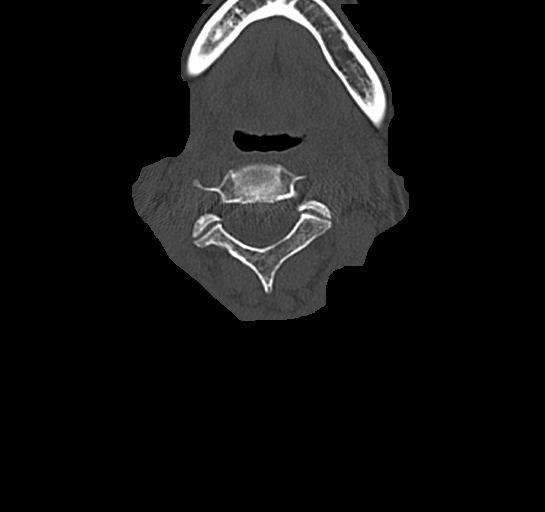
[im 85/107  bone]
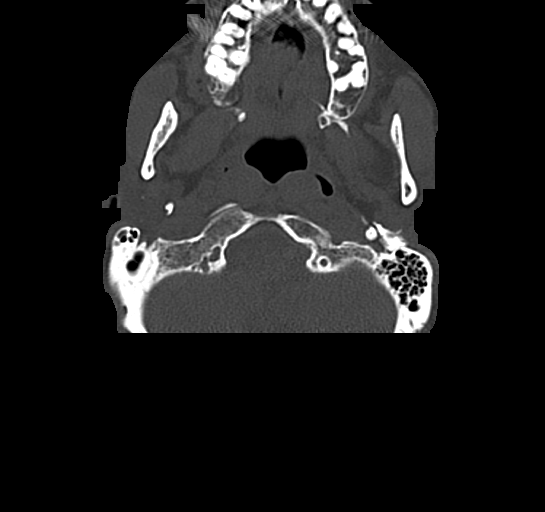

[10 of 33 positions shown; findings below may reference images not displayed]

FINDINGS: CT HEAD FINDINGS

The examination is degraded by motion.

Brain: No mass lesion, intraparenchymal hemorrhage or extra-axial
collection. No evidence of acute cortical infarct. There is mildly
age advanced atrophy. There is periventricular hypoattenuation
compatible with chronic microvascular disease. There is focal
calcification noted in the left occipital lobe.

Vascular: No hyperdense vessel or unexpected calcification.

Skull: Normal visualized skull base, calvarium and extracranial soft
tissues.

Sinuses/Orbits: No sinus fluid levels or advanced mucosal
thickening. No mastoid effusion. Normal orbits.

CT CERVICAL SPINE FINDINGS

Alignment: No static subluxation. Facets are aligned. Occipital
condyles are normally positioned.

Skull base and vertebrae: No acute fracture.

Soft tissues and spinal canal: No prevertebral fluid or swelling. No
visible canal hematoma.

Disc levels: No advanced spinal canal or neural foraminal stenosis.

Upper chest: No pneumothorax, pulmonary nodule or pleural effusion.

Other: Normal visualized paraspinal cervical soft tissues.
IMPRESSION: 1. Motion degraded head CT. Within that limitation, no acute
intracranial abnormality.
2. Mildly age advanced cerebral atrophy.
3. No acute fracture or static subluxation of the cervical spine.
